# Patient Record
Sex: Female | Born: 1985 | Race: White | Hispanic: No | Marital: Married | Smoking: Former smoker
Health system: Southern US, Community
[De-identification: ages and names within clinical notes are randomized; demographics above are authoritative.]

## PROBLEM LIST (undated history)

## (undated) DIAGNOSIS — B019 Varicella without complication: Secondary | ICD-10-CM

## (undated) DIAGNOSIS — O24419 Gestational diabetes mellitus in pregnancy, unspecified control: Secondary | ICD-10-CM

## (undated) DIAGNOSIS — Z202 Contact with and (suspected) exposure to infections with a predominantly sexual mode of transmission: Secondary | ICD-10-CM

## (undated) DIAGNOSIS — H9192 Unspecified hearing loss, left ear: Secondary | ICD-10-CM

## (undated) DIAGNOSIS — R51 Headache: Secondary | ICD-10-CM

## (undated) DIAGNOSIS — F32A Depression, unspecified: Secondary | ICD-10-CM

## (undated) DIAGNOSIS — E119 Type 2 diabetes mellitus without complications: Secondary | ICD-10-CM

## (undated) DIAGNOSIS — S129XXA Fracture of neck, unspecified, initial encounter: Secondary | ICD-10-CM

## (undated) DIAGNOSIS — G43909 Migraine, unspecified, not intractable, without status migrainosus: Secondary | ICD-10-CM

## (undated) DIAGNOSIS — F419 Anxiety disorder, unspecified: Secondary | ICD-10-CM

## (undated) DIAGNOSIS — F329 Major depressive disorder, single episode, unspecified: Secondary | ICD-10-CM

## (undated) HISTORY — DX: Migraine, unspecified, not intractable, without status migrainosus: G43.909

## (undated) HISTORY — DX: Anxiety disorder, unspecified: F41.9

## (undated) HISTORY — DX: Fracture of neck, unspecified, initial encounter: S12.9XXA

## (undated) HISTORY — DX: Varicella without complication: B01.9

## (undated) HISTORY — DX: Headache: R51

## (undated) HISTORY — DX: Unspecified hearing loss, left ear: H91.92

## (undated) HISTORY — DX: Major depressive disorder, single episode, unspecified: F32.9

## (undated) HISTORY — DX: Contact with and (suspected) exposure to infections with a predominantly sexual mode of transmission: Z20.2

## (undated) HISTORY — DX: Gestational diabetes mellitus in pregnancy, unspecified control: O24.419

## (undated) HISTORY — DX: Type 2 diabetes mellitus without complications: E11.9

## (undated) HISTORY — DX: Depression, unspecified: F32.A

---
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
## 2003-04-05 LAB — CONVERTED CEMR LAB: Pap Smear: ABNORMAL

## 2009-10-29 ENCOUNTER — Ambulatory Visit: Payer: Self-pay | Admitting: Internal Medicine

## 2009-10-29 DIAGNOSIS — F32A Depression, unspecified: Secondary | ICD-10-CM | POA: Insufficient documentation

## 2009-10-29 DIAGNOSIS — R1011 Right upper quadrant pain: Secondary | ICD-10-CM | POA: Insufficient documentation

## 2009-10-29 DIAGNOSIS — R51 Headache: Secondary | ICD-10-CM | POA: Insufficient documentation

## 2009-10-29 DIAGNOSIS — F329 Major depressive disorder, single episode, unspecified: Secondary | ICD-10-CM

## 2009-10-29 DIAGNOSIS — F419 Anxiety disorder, unspecified: Secondary | ICD-10-CM | POA: Insufficient documentation

## 2009-10-29 DIAGNOSIS — B977 Papillomavirus as the cause of diseases classified elsewhere: Secondary | ICD-10-CM | POA: Insufficient documentation

## 2009-10-29 DIAGNOSIS — F411 Generalized anxiety disorder: Secondary | ICD-10-CM

## 2009-10-29 DIAGNOSIS — R519 Headache, unspecified: Secondary | ICD-10-CM | POA: Insufficient documentation

## 2009-10-29 LAB — CONVERTED CEMR LAB
ALT: 23 units/L (ref 0–35)
AST: 19 units/L (ref 0–37)
Albumin: 3.6 g/dL (ref 3.5–5.2)
Alkaline Phosphatase: 89 units/L (ref 39–117)
BUN: 9 mg/dL (ref 6–23)
Basophils Absolute: 0 10*3/uL (ref 0.0–0.1)
Basophils Relative: 0.4 % (ref 0.0–3.0)
Beta hcg, urine, semiquantitative: NEGATIVE
Bilirubin Urine: NEGATIVE
Bilirubin, Direct: 0.1 mg/dL (ref 0.0–0.3)
Blood in Urine, dipstick: NEGATIVE
CO2: 27 meq/L (ref 19–32)
Calcium: 8.5 mg/dL (ref 8.4–10.5)
Chloride: 98 meq/L (ref 96–112)
Creatinine, Ser: 0.5 mg/dL (ref 0.4–1.2)
Eosinophils Absolute: 0 10*3/uL (ref 0.0–0.7)
Eosinophils Relative: 0.3 % (ref 0.0–5.0)
GFR calc non Af Amer: 155.19 mL/min (ref >60)
Glucose, Bld: 97 mg/dL (ref 70–99)
Glucose, Urine, Semiquant: NEGATIVE
HCT: 36 % (ref 36.0–46.0)
Hemoglobin: 12.2 g/dL (ref 12.0–15.0)
Ketones, urine, test strip: NEGATIVE
Lymphocytes Relative: 14.2 % (ref 12.0–46.0)
Lymphs Abs: 1.8 10*3/uL (ref 0.7–4.0)
MCHC: 34 g/dL (ref 30.0–36.0)
MCV: 92.2 fL (ref 78.0–100.0)
Monocytes Absolute: 0.7 10*3/uL (ref 0.1–1.0)
Monocytes Relative: 5.8 % (ref 3.0–12.0)
Neutro Abs: 9.8 10*3/uL — ABNORMAL HIGH (ref 1.4–7.7)
Neutrophils Relative %: 79.3 % — ABNORMAL HIGH (ref 43.0–77.0)
Nitrite: NEGATIVE
Platelets: 403 10*3/uL — ABNORMAL HIGH (ref 150.0–400.0)
Potassium: 3.9 meq/L (ref 3.5–5.1)
Protein, U semiquant: NEGATIVE
RBC: 3.9 M/uL (ref 3.87–5.11)
RDW: 14.6 % (ref 11.5–14.6)
Sed Rate: 39 mm/hr — ABNORMAL HIGH (ref 0–22)
Sodium: 137 meq/L (ref 135–145)
Specific Gravity, Urine: >=1.03
TSH: 1.31 microintl units/mL (ref 0.35–5.50)
Total Bilirubin: 0.5 mg/dL (ref 0.3–1.2)
Total Protein: 6.5 g/dL (ref 6.0–8.3)
Urobilinogen, UA: 0.2
WBC Urine, dipstick: NEGATIVE
WBC: 12.4 10*3/uL — ABNORMAL HIGH (ref 4.5–10.5)
hCG, Beta Chain, Quant, S: 0.35 milliintl units/mL
pH: 6

## 2009-10-30 ENCOUNTER — Telehealth: Payer: Self-pay | Admitting: Internal Medicine

## 2009-10-31 ENCOUNTER — Ambulatory Visit: Payer: Self-pay | Admitting: Family Medicine

## 2009-10-31 DIAGNOSIS — M4310 Spondylolisthesis, site unspecified: Secondary | ICD-10-CM | POA: Insufficient documentation

## 2009-11-02 ENCOUNTER — Telehealth: Payer: Self-pay | Admitting: Internal Medicine

## 2009-11-06 ENCOUNTER — Telehealth: Payer: Self-pay | Admitting: Internal Medicine

## 2009-11-09 ENCOUNTER — Encounter: Payer: Self-pay | Admitting: Internal Medicine

## 2010-05-04 NOTE — Progress Notes (Signed)
Summary: Follow up on how pt is doing  Phone Note Outgoing Call   Call placed by: Romualdo Bolk, CMA Duncan Dull),  November 02, 2009 12:48 PM Call placed to: Patient Summary of Call: Calling to see how pt is doing. Left message for pt to call back. Pt needs to collect another ua for gc/cha. Initial call taken by: Romualdo Bolk, CMA (AAMA),  November 02, 2009 12:51 PM  Follow-up for Phone Call        Spoke to pt and she is feeling better. She has some pain but not as bad as before. Pt to come in either Wed or thurs for the urine gc/ch. test. Follow-up by: Romualdo Bolk, CMA (AAMA),  November 03, 2009 4:36 PM

## 2010-05-04 NOTE — Letter (Signed)
Summary: PATIENT HISTORY  PATIENT HISTORY   Imported By: Everrett Coombe 11/09/2009 15:06:23  _____________________________________________________________________  External Attachment:    Type:   Image     Comment:   External Document

## 2010-05-04 NOTE — Progress Notes (Signed)
Summary: Pt needs ov today for possible infection  Phone Note Call from Patient Call back at Home Phone [phone]   Caller: Patient Summary of Call: I have called all numbers four times and left message to have pt call office to come in today for an appt to discuss possible infection. I tried to call the numbers that pt gave [place] to contact her mom [phone] and [phone] but these numbers were disconnected.  Initial call taken by: Romualdo Bolk, CMA Duncan Dull),  October 30, 2009 9:59 AM  Follow-up for Phone Call        Pt called back and left a voicemail saying that she is still having abd. pain.  I called pt back but got her voicemail again. I left a message saying that we need her to come in before 4pm today but if she wanted to call us back to have someone overhead page me. Follow-up by: Romualdo Bolk, CMA Duncan Dull),  October 30, 2009 2:34 PM  Additional Follow-up for Phone Call Additional follow up Details #1::        Per Dr. Fabian Sharp Doxy 100mg  1 by mouth two times a day x 10 days. Also see sat md tomorrow.   Pt aware and appt made. Pt states that she is in alot of abd. pain and is using enema.   Per Dr. Fabian Sharp Ibuprofen 800mg  #30 1 q 6-8 hours as needed for pain no refills. Pt aware of this as well. Additional Follow-up by: Romualdo Bolk, CMA (AAMA),  October 30, 2009 3:35 PM    New/Updated Medications: DOXYCYCLINE HYCLATE 100 MG TABS (DOXYCYCLINE HYCLATE) 1 by mouth two times a day x 10 days IBUPROFEN 800 MG TABS (IBUPROFEN) 1 by mouth q 6-8 hours as needed for pain Prescriptions: IBUPROFEN 800 MG TABS (IBUPROFEN) 1 by mouth q 6-8 hours as needed for pain  #30 x 0   Entered by:   Romualdo Bolk, CMA (AAMA)   Authorized by:   Madelin Headings MD   Signed by:   Romualdo Bolk, CMA (AAMA) on 10/30/2009   Method used:   Electronically to        [facility] [id number] (retail)       [address] [place], [place]  [address]       Ph: [phone]       Fax:  [phone]   RxID:   [id number] DOXYCYCLINE HYCLATE 100 MG TABS (DOXYCYCLINE HYCLATE) 1 by mouth two times a day x 10 days  #20 x 0   Entered by:   Romualdo Bolk, CMA (AAMA)   Authorized by:   Madelin Headings MD   Signed by:   Romualdo Bolk, CMA (AAMA) on 10/30/2009   Method used:   Electronically to        [facility] [place] [address] (retail)       [address] [place], [place]  [address]       Ph: [phone]       Fax: [phone]   RxID:   [id number]
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
## 2010-05-04 NOTE — Assessment & Plan Note (Signed)
Summary: abd. pain/ssc   Vital Signs:  Patient profile:   25 year old female Menstrual status:  regular Weight:      144 pounds Temp:     98.3 degrees F oral Pulse rate:   88 / minute Pulse rhythm:   regular BP sitting:   124 / 78  (right arm) Cuff size:   regular  Vitals Entered By: Lowella Petties CMA (October 31, 2009 10:18 AM) CC: Pain under right ribs   History of Present Illness: hAS SOME UNEXPLAINED PAIN UNDER HER RIB CAGE,   Monday started, the sharpest pain, started after having some chinese food. Thursday felt could. Feels a littel bit bit better and feels like she has more energy. Has a high pain tolerance.   LMP 10/15/2009  review the case in full, reviewed all laboratory, and reviewed notes and personal communication with Dr. Fabian Sharp.   the patient is a 5 day history of abdominal pain, primarily located in the RIGHT upper quadrant, with some additional intermittent pain in the other quadrants of the abdomen. Today, she is on day 2 doxycycline, and she is feeling better.  additionally she did have an enema, which did not produce much stool.  She's currently not febrile, she is eating, but she continues to have some significant abdominal pain, primarily located in the RIGHT upper quadrant. Risk factors include some intercourse without condom use.  Continues to deny vomiting or diarrhea. No significant vaginal discharge.   Mild LEFT shift and mild elevation of white blood count, but with a very normal CT of the abdomen.  Also discussed with patient that she does have an old spondylolisthesis is present and picked up on CT exam. This would not explain the patient's abdominal pain. 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 She does have a notable history for back pain when growing up. True anterolisthesis is present.  Tests: (1) CBC Platelet w/Diff (CBCD)   White Cell Count     [H]   12.4 K/uL                   4.5-10.5   Red Cell Count            3.90 Mil/uL                 3.87-5.11   Hemoglobin                12.2 g/dL                   16.1-09.6   Hematocrit                36.0 %                      36.0-46.0   MCV                       92.2 fl                     78.0-100.0   MCHC                      34.0 g/dL                   04.5-40.9   RDW  14.6 %                      11.5-14.6   Platelet Count       [H]  403.0 K/uL                  150.0-400.0   Neutrophil %         [H]  79.3 %                      43.0-77.0   Lymphocyte %              14.2 %                      12.0-46.0   Monocyte %                5.8 %                       3.0-12.0   Eosinophils%              0.3 %                       0.0-5.0   Basophils %               0.4 %                       0.0-3.0   Neutrophill Absolute [H]  9.8 K/uL                    1.4-7.7   Lymphocyte Absolute       1.8 K/uL                    0.7-4.0   Monocyte Absolute         0.7 K/uL                    0.1-1.0  Eosinophils, Absolute                             0.0 K/uL                    0.0-0.7   Basophils Absolute        0.0 K/uL                    0.0-0.1  Tests: (2) BMP (METABOL)   Sodium                    137 mEq/L                   135-145   Potassium                 3.9 mEq/L                   3.5-5.1   Chloride                  98 mEq/L                    96-112   Carbon Dioxide            27 mEq/L  19-32   Glucose                   97 mg/dL                    16-10   BUN                       9 mg/dL                     9-60   Creatinine                0.5 mg/dL                   4.5-4.0   Calcium                   8.5 mg/dL                   9.8-11.9   GFR                       155.19 mL/min               >60    Tests: (3) Hepatic/Liver Function Panel (HEPATIC)   Total Bilirubin           0.5 mg/dL                   1.4-7.8   Direct Bilirubin          0.1  mg/dL                   2.9-5.6   Alkaline Phosphatase      89 U/L                      39-117   AST                       19 U/L                      0-37   ALT                       23 U/L                      0-35   Total Protein             6.5 g/dL                    2.1-3.0   Albumin                   3.6 g/dL                    8.6-5.7  Tests: (4) TSH (TSH)   FastTSH                   1.31 uIU/mL                 0.35-5.50  Tests: (5) Sed Rate (ESR)   Sed Rate             [H]  39 mm/hr                    0-22  Tests: (6)  Pregnancy Serum, Quant (HCG-QN)   BHCG                      0.35 mIU/mL     0 - 1 Weeks  5 - 50     1 - 2 Weeks  50 - 500     2 - 3 Weeks  100 - 5,000     3 - 4 Weeks  500 - 10,000     4 - 5 Weeks  1,000 - 50,000     5 - 6 Weeks  10,000 - 100,000     6 - 8 Weeks  15,000 - 200,000     8 - 12 Weeks  10,000 - 100,0000     CT ABD/PELVIS W CM - [id number]   Clinical Data: Sudden onset right sided abdominal pain with low grade fever and constipation.   CT ABDOMEN AND PELVIS WITH CONTRAST   Technique:  Multidetector CT imaging of the abdomen and pelvis was performed following the standard protocol during bolus administration of intravenous contrast.   Contrast: 100 ml Omnipaque-300   Comparison: None.   Findings: Lung bases show minimal dependent atelectasis.  Heart size normal.  No pericardial or pleural effusion.   Liver, gallbladder and adrenal glands are unremarkable.  Sub centimeter low attenuation lesions in the kidneys are likely cysts. Spleen, pancreas, stomach, and bowel, including appendix, are unremarkable.  Appendix is best seen on coronal images 37-43). Uterus and ovaries are visualized.  No pathologically enlarged lymph nodes.  No free fluid.  No worrisome lytic or sclerotic lesions. Bilateral L5 pars defects with grade II anterolisthesis of L5 on S1, with associated degenerative disc disease.   IMPRESSION:   1.  No evidence of acute  appendicitis.  No findings to explain the patient's given symptoms. 2.  Bilateral L5 pars defects with grade II anterolisthesis of L5 on S1.   Read By:  Reyes Ivan.,  M.D.     Released By:  Reyes Ivan.,  M.D.   Allergies: 1)  ! Sulfa  Past History:  Past medical, surgical, family and social histories (including risk factors) reviewed, and no changes noted (except as noted below).  Past Medical History: Reviewed history from 10/29/2009 and no changes required. Depression/Anxiety G0P0 ? last pap   hx of HPV Varicella  Headache migraines   Past Surgical History: Reviewed history from 10/29/2009 and no changes required. 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 Father: Kidney Stones Mother: Chron's, HBP, borderline DM Siblings: Brother- High Cholesterol, colon polyps GP with ? too much iron   Social History: Reviewed history from 10/29/2009 and no changes required. Occupation: unemployed Divorced Current Smoker    Alcohol use-yes-Socially Drug use-no Regular exercise-yes unsure what her insurance is.  living  with mom currently   Review of Systems      See HPI  Physical Exam  General:  Well-developed,well-nourished,in no acute distress; alert,appropriate and cooperative throughout examination Head:  Normocephalic and atraumatic without obvious abnormalities. No apparent alopecia or balding. Lungs:  Normal respiratory effort, chest expands symmetrically. Lungs are clear to auscultation, no crackles or wheezes. Heart:  Normal rate and regular rhythm. S1 and S2 normal without gallop, murmur, click, rub or other extra sounds. Abdomen:  upper quadrant is tender to palpation. Minimal tenderness to palpation, with occasional tenderness to palpation in the RIGHT and LEFT lower quadrants. No rebound tenderness. No guarding. No distention,  no masses. No hepatosplenomegaly. No hernias. Genitalia:    Bimanual exam performed. Cervix is nontender. Ovaries are palpated and nontender. Uterus is nontender. Cervical Nodes:  No lymphadenopathy noted Inguinal Nodes:  No significant adenopathy Psych:  Cognition and judgment appear intact. Alert and cooperative with normal attention span and concentration. No apparent delusions, illusions, hallucinations   Impression & Recommendations:  Problem # 1:  ABDOMINAL PAIN RIGHT UPPER QUADRANT (ICD-789.01) continues to have RIGHT quadrant pain, though it is mildly improved. There was a normal CT abdomen and pelvis, but outpatient HIDA scan set-up next week if symptoms persist at recheck would be reasonable. I've asked her to follow up with her primary care provider on Monday.  Additionally, we will cover for gonorrhea exposure. Gonorrhea Chlamydia continue to be pending  Nontoxic.  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  this is old, nonacute, does not need further followup other than routine core stability training.  At this point, there would be a fibrous old union, and stable.  Complete Medication List: 1)  Celexa 40 Mg Tabs (Citalopram hydrobromide) .Marland Kitchen.. 1 by mouth once daily 2)  Alprazolam 1 Mg Tabs (Alprazolam) .Marland Kitchen.. 1 by mouth three times a day 3)  Doxycycline Hyclate 100 Mg Tabs (Doxycycline hyclate) .Marland Kitchen.. 1 by mouth two times a day x 10 days 4)  Ibuprofen 800 Mg Tabs (Ibuprofen) .Marland Kitchen.. 1 by mouth q 6-8 hours as needed for pain  Patient Instructions: 1)  CONSTIPATION 2)  Difficult, uncomfortable, infrequent BM 3)  1. Warm prune juice, hot water, tea, coffee, apple juice 4)  2. Prevention: drink 8 glasses water daily, FIBER (raw fruit, veggies, bran cereal, whole grains), regular exercise 5)  3. Bulk formers like Metamucil (psyllium), Citrucel (methylcellulose) usually help 6)  4. Stool softeners (Docusate)  occaisionally OK 7)  5. Occaisional over the counter Miralax usually safe 8)  6. Overuse of stimulant  laxatives (Ex-Lax, Mag Citrate, Dulcolax, etc.) can be habit forming     Medication Administration  Injection # 1:    Medication: Rocephin  250mg     Diagnosis: ABDOMINAL PAIN RIGHT UPPER QUADRANT (ICD-789.01)    Route: IM    Site: LUOQ gluteus    Exp Date: 10/2010    Lot #: [id number]    Mfr: [email or web address]    Patient tolerated injection without complications    Given by: Lowella Petties CMA (October 31, 2009 11:58 AM)  Orders Added: 1)  Rocephin  250mg  [J0696] 2)  Admin of Therapeutic Inj  intramuscular or subcutaneous [96372] 3)  Est. Patient Level IV [14782]

## 2010-05-04 NOTE — Progress Notes (Addendum)
Summary: never got labs done  Phone Note From Other Clinic   Caller: Lab Summary of Call: Pt never came back in to get Urine GC/Ch done. Initial call taken by: Romualdo Bolk, CMA (AAMA),  November 06, 2009 4:56 PM

## 2010-05-05 DIAGNOSIS — S129XXA Fracture of neck, unspecified, initial encounter: Secondary | ICD-10-CM | POA: Insufficient documentation

## 2010-05-05 HISTORY — DX: Fracture of neck, unspecified, initial encounter: S12.9XXA

## 2010-05-22 ENCOUNTER — Emergency Department (HOSPITAL_COMMUNITY)
Admission: EM | Admit: 2010-05-22 | Discharge: 2010-05-23 | Disposition: A | Discharge status: Home / Self Care | Payer: No Typology Code available for payment source | Attending: Emergency Medicine | Admitting: Emergency Medicine

## 2010-05-22 ENCOUNTER — Emergency Department (HOSPITAL_COMMUNITY): Payer: Self-pay

## 2010-05-22 ENCOUNTER — Emergency Department (HOSPITAL_COMMUNITY): Payer: No Typology Code available for payment source

## 2010-05-22 DIAGNOSIS — M79609 Pain in unspecified limb: Secondary | ICD-10-CM | POA: Insufficient documentation

## 2010-05-22 DIAGNOSIS — S12100A Unspecified displaced fracture of second cervical vertebra, initial encounter for closed fracture: Secondary | ICD-10-CM | POA: Insufficient documentation

## 2010-05-22 DIAGNOSIS — S060X9A Concussion with loss of consciousness of unspecified duration, initial encounter: Secondary | ICD-10-CM | POA: Insufficient documentation

## 2010-05-22 DIAGNOSIS — S8010XA Contusion of unspecified lower leg, initial encounter: Secondary | ICD-10-CM | POA: Insufficient documentation

## 2010-05-22 DIAGNOSIS — IMO0002 Reserved for concepts with insufficient information to code with codable children: Secondary | ICD-10-CM | POA: Insufficient documentation

## 2010-05-22 DIAGNOSIS — M542 Cervicalgia: Secondary | ICD-10-CM | POA: Insufficient documentation

## 2010-05-22 DIAGNOSIS — S0100XA Unspecified open wound of scalp, initial encounter: Secondary | ICD-10-CM | POA: Insufficient documentation

## 2010-05-22 DIAGNOSIS — R51 Headache: Secondary | ICD-10-CM | POA: Insufficient documentation

## 2010-05-22 DIAGNOSIS — M25559 Pain in unspecified hip: Secondary | ICD-10-CM | POA: Insufficient documentation

## 2010-05-22 LAB — POCT I-STAT, CHEM 8
BUN: 9 mg/dL (ref 6–23)
Calcium, Ion: 1.26 mmol/L (ref 1.12–1.32)
Chloride: 104 mEq/L (ref 96–112)
Creatinine, Ser: 0.8 mg/dL (ref 0.4–1.2)
HCT: 41 % (ref 36.0–46.0)
Hemoglobin: 13.9 g/dL (ref 12.0–15.0)
Sodium: 141 mEq/L (ref 135–145)

## 2010-06-10 ENCOUNTER — Encounter: Payer: Self-pay | Admitting: Internal Medicine

## 2010-08-13 IMAGING — CT CT ABD-PELV W/ CM
2 of 4 series · 17 of 46 positions shown, 19 images · IV contrast (Omnipaque 300)
Comparison: None.

CLINICAL DATA: Sudden onset right sided abdominal pain with low
grade fever and constipation.

CT ABDOMEN AND PELVIS WITH CONTRAST
TECHNIQUE: Multidetector CT imaging of the abdomen and pelvis was
performed following the standard protocol during bolus
administration of intravenous contrast.
Contrast: 100 ml Lmnipaque-PAA

[Series 2: abd/ pel 5mm · axial · 0.76mm/px · z∈[-448,-18]mm · 14 of 94 slices shown, 16 images]
[im 4/94  soft-tissue]
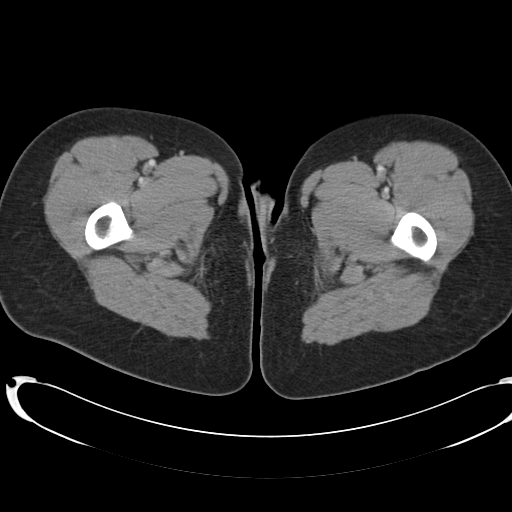
[im 4/94  bone]
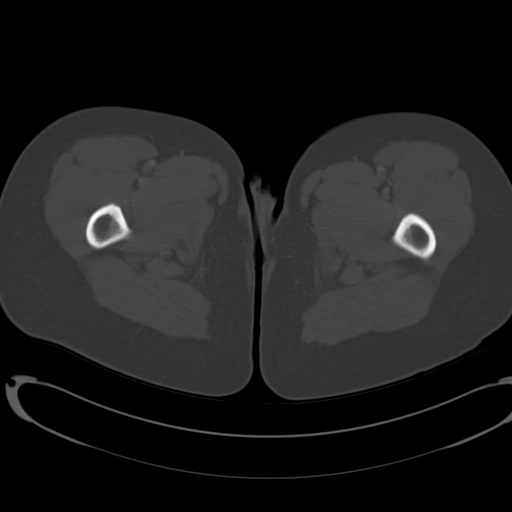
[im 12/94  soft-tissue]
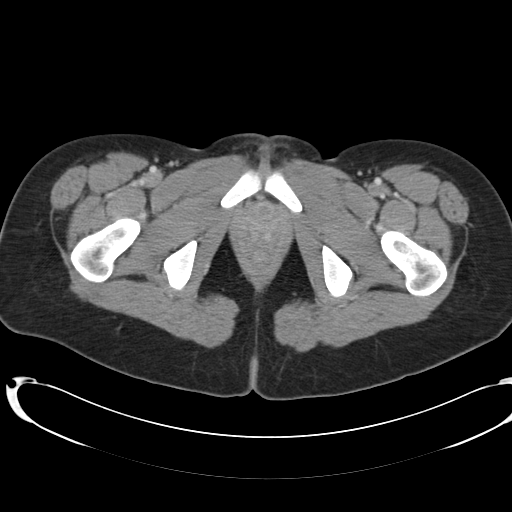
[im 19/94  soft-tissue]
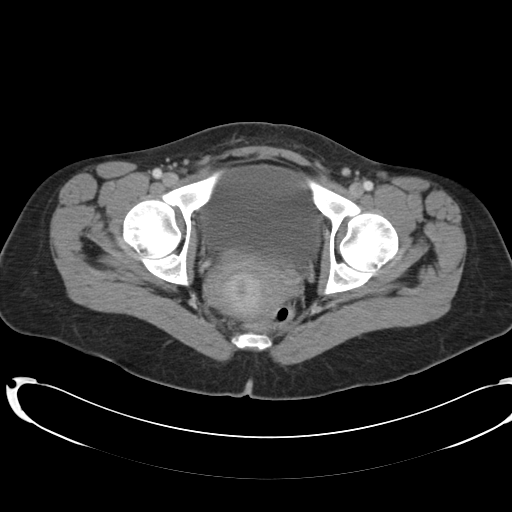
[im 27/94  soft-tissue]
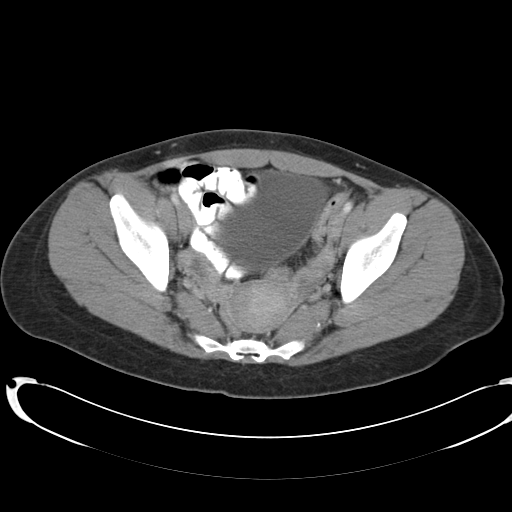
[im 30/94  soft-tissue]
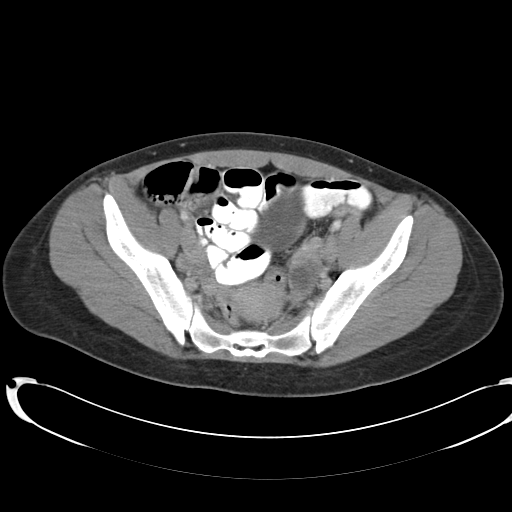
[im 38/94  soft-tissue]
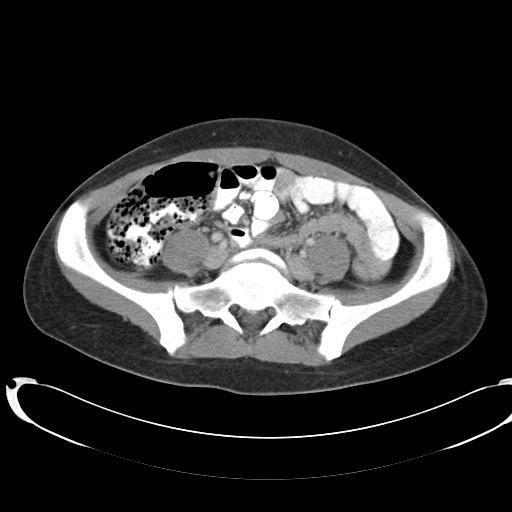
[im 45/94  soft-tissue]
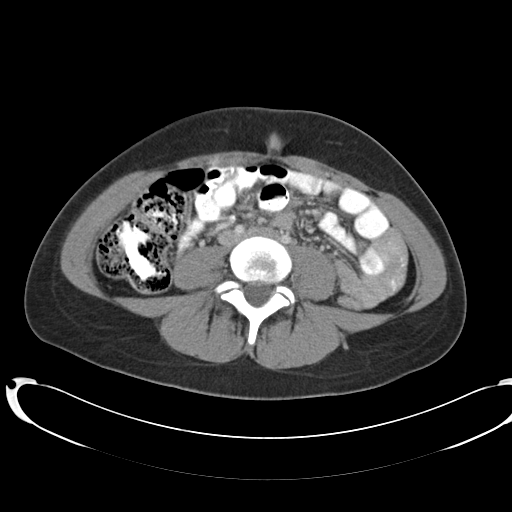
[im 49/94  soft-tissue]
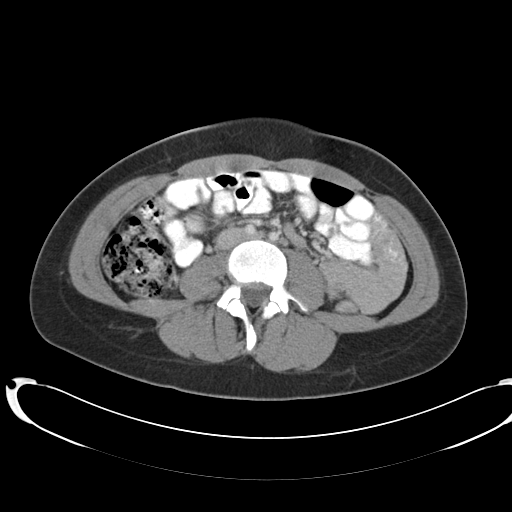
[im 56/94  soft-tissue]
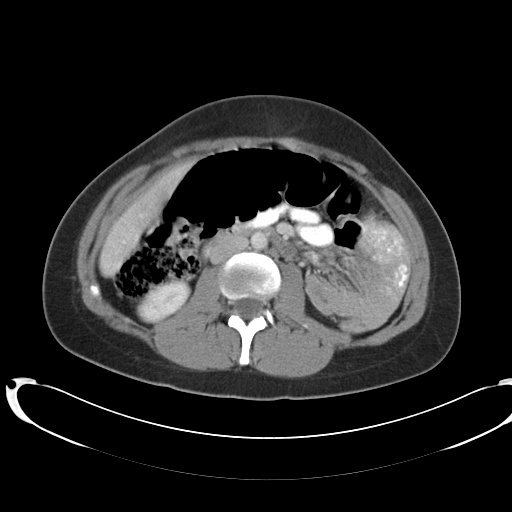
[im 56/94  bone]
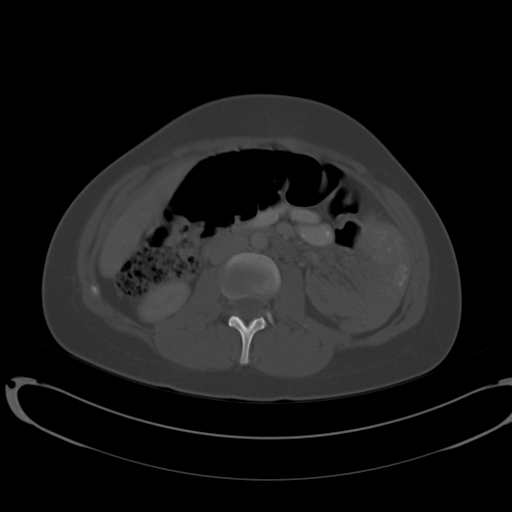
[im 64/94  soft-tissue]
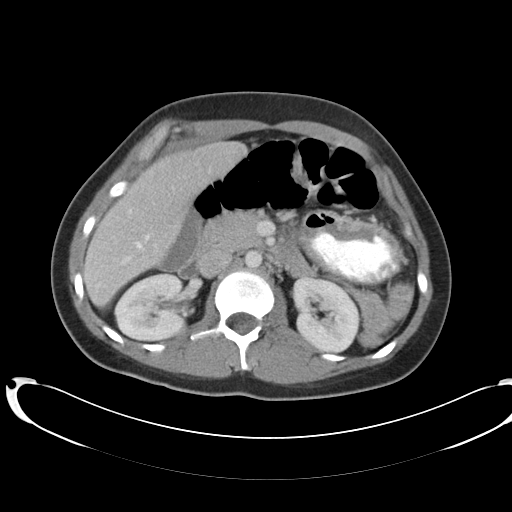
[im 71/94  soft-tissue]
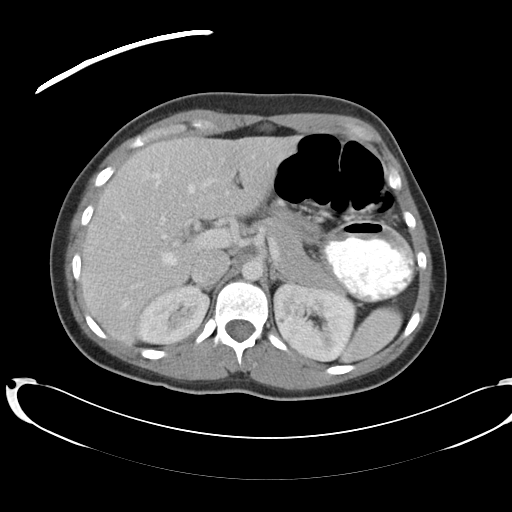
[im 75/94  soft-tissue]
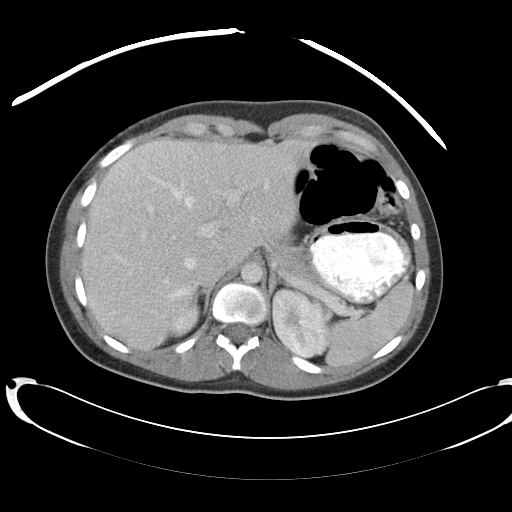
[im 82/94  soft-tissue]
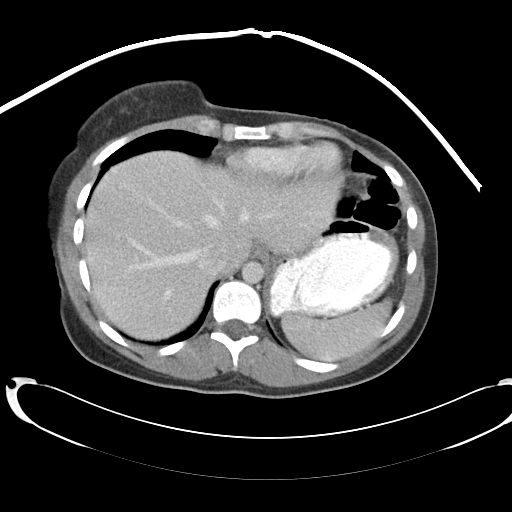
[im 90/94  soft-tissue]
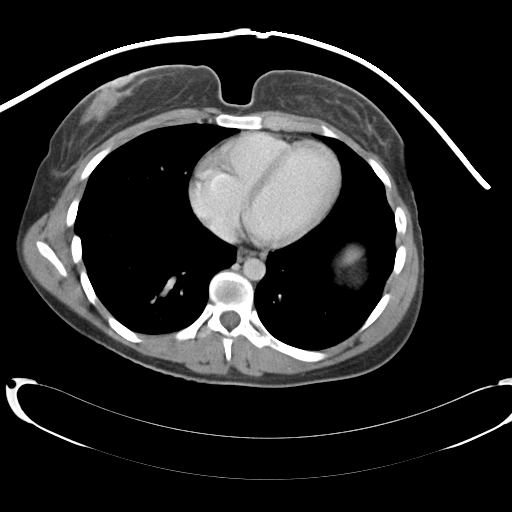

[Series 602: <mpr range> · coronal · 0.91mm/px · 3 of 106 slices shown]
[im 36/106  soft-tissue]
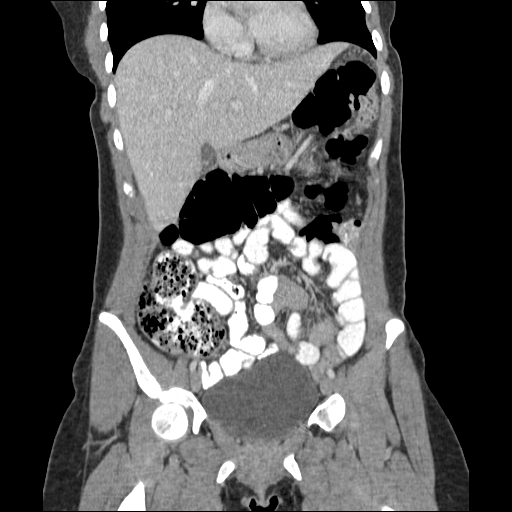
[im 47/106  soft-tissue]
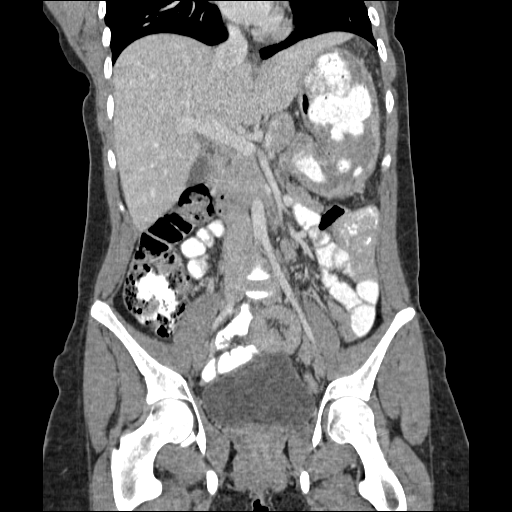
[im 59/106  soft-tissue]
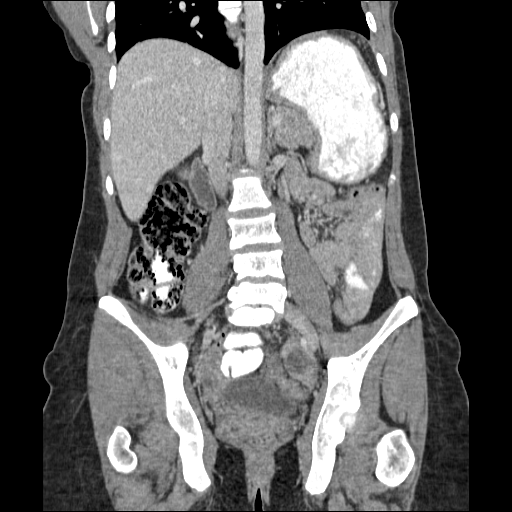

[17 of 46 positions shown; findings below may reference images not displayed]

FINDINGS: Lung bases show minimal dependent atelectasis.  Heart
size normal.  No pericardial or pleural effusion.

Liver, gallbladder and adrenal glands are unremarkable.  Sub
centimeter low attenuation lesions in the kidneys are likely cysts.
Spleen, pancreas, stomach, and bowel, including appendix, are
unremarkable.  Appendix is best seen on coronal images 37-43).
Uterus and ovaries are visualized.  No pathologically enlarged
lymph nodes.  No free fluid.  No worrisome lytic or sclerotic
lesions. Bilateral L5 pars defects with grade II anterolisthesis of
L5 on S1, with associated degenerative disc disease.
IMPRESSION: 1.  No evidence of acute appendicitis.  No findings to explain the
patient's given symptoms.
2.  Bilateral L5 pars defects with grade II anterolisthesis of L5
on S1.

## 2010-11-12 ENCOUNTER — Encounter: Payer: Self-pay | Admitting: Internal Medicine

## 2010-11-15 ENCOUNTER — Ambulatory Visit (INDEPENDENT_AMBULATORY_CARE_PROVIDER_SITE_OTHER): Payer: BC Managed Care – PPO | Admitting: Family Medicine

## 2010-11-15 ENCOUNTER — Encounter: Payer: Self-pay | Admitting: Family Medicine

## 2010-11-15 VITALS — BP 130/90 | Temp 98.3°F

## 2010-11-15 DIAGNOSIS — R51 Headache: Secondary | ICD-10-CM

## 2010-11-15 DIAGNOSIS — R519 Headache, unspecified: Secondary | ICD-10-CM

## 2010-11-15 MED ORDER — NORTRIPTYLINE HCL 10 MG PO CAPS: 10.0000 mg | ORAL_CAPSULE | Freq: Every day | ORAL | Status: DC

## 2010-11-15 NOTE — Patient Instructions (Signed)
Gradually wean off ibuprofen and other analgesics. May increase Pamelor to two at night if headaches no better in one week.
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
## 2010-11-25 ENCOUNTER — Ambulatory Visit (INDEPENDENT_AMBULATORY_CARE_PROVIDER_SITE_OTHER): Payer: BC Managed Care – PPO | Admitting: Internal Medicine

## 2010-11-25 ENCOUNTER — Encounter: Payer: Self-pay | Admitting: Internal Medicine

## 2010-11-25 VITALS — BP 140/82 | HR 88 | Wt 155.0 lb

## 2010-11-25 DIAGNOSIS — S129XXA Fracture of neck, unspecified, initial encounter: Secondary | ICD-10-CM

## 2010-11-25 DIAGNOSIS — G44301 Post-traumatic headache, unspecified, intractable: Secondary | ICD-10-CM

## 2010-11-25 DIAGNOSIS — R51 Headache: Secondary | ICD-10-CM

## 2010-11-25 DIAGNOSIS — G44309 Post-traumatic headache, unspecified, not intractable: Secondary | ICD-10-CM

## 2010-11-25 MED ORDER — NORTRIPTYLINE HCL 10 MG PO CAPS: 20.0000 mg | ORAL_CAPSULE | Freq: Every day | ORAL | Status: DC

## 2010-11-25 MED ORDER — KETOROLAC TROMETHAMINE 60 MG/2ML IM SOLN
60.0000 mg | Freq: Once | INTRAMUSCULAR | Status: AC
  Administered 2010-11-25: 60 mg via INTRAMUSCULAR
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
## 2010-11-29 ENCOUNTER — Ambulatory Visit: Payer: No Typology Code available for payment source | Admitting: Internal Medicine

## 2011-10-17 ENCOUNTER — Other Ambulatory Visit: Payer: BC Managed Care – PPO

## 2011-10-31 ENCOUNTER — Encounter: Payer: BC Managed Care – PPO | Admitting: Internal Medicine

## 2011-10-31 DIAGNOSIS — Z0289 Encounter for other administrative examinations: Secondary | ICD-10-CM

## 2012-05-10 ENCOUNTER — Other Ambulatory Visit: Payer: BC Managed Care – PPO

## 2012-05-14 ENCOUNTER — Ambulatory Visit: Payer: BC Managed Care – PPO | Admitting: Internal Medicine

## 2013-10-01 ENCOUNTER — Telehealth: Payer: Self-pay | Admitting: Internal Medicine

## 2013-10-01 NOTE — Telephone Encounter (Signed)
Please ask if there  Are forms additional problems  Ok to make preventive visit cpx if needed may need another visit if  problem to evalute also .

## 2013-10-01 NOTE — Telephone Encounter (Signed)
Pt was last seen aug 2012 and would like to have cpx asap. Can I create 30  Min slot?

## 2013-10-02 NOTE — Telephone Encounter (Signed)
Pt has been sch for 10/28/13

## 2013-10-28 ENCOUNTER — Other Ambulatory Visit (HOSPITAL_COMMUNITY)
Admission: RE | Admit: 2013-10-28 | Discharge: 2013-10-28 | Disposition: A | Discharge status: Home / Self Care | Payer: No Typology Code available for payment source | Source: Ambulatory Visit | Attending: Internal Medicine | Admitting: Internal Medicine

## 2013-10-28 ENCOUNTER — Ambulatory Visit (INDEPENDENT_AMBULATORY_CARE_PROVIDER_SITE_OTHER): Payer: No Typology Code available for payment source | Admitting: Internal Medicine

## 2013-10-28 ENCOUNTER — Encounter: Payer: Self-pay | Admitting: Internal Medicine

## 2013-10-28 VITALS — BP 114/78 | Temp 98.7°F | Ht 64.5 in | Wt 179.0 lb

## 2013-10-28 DIAGNOSIS — R21 Rash and other nonspecific skin eruption: Secondary | ICD-10-CM | POA: Insufficient documentation

## 2013-10-28 DIAGNOSIS — Z0000 Encounter for general adult medical examination without abnormal findings: Secondary | ICD-10-CM | POA: Insufficient documentation

## 2013-10-28 DIAGNOSIS — F411 Generalized anxiety disorder: Secondary | ICD-10-CM

## 2013-10-28 DIAGNOSIS — G8929 Other chronic pain: Secondary | ICD-10-CM

## 2013-10-28 DIAGNOSIS — Z789 Other specified health status: Secondary | ICD-10-CM

## 2013-10-28 DIAGNOSIS — Z9189 Other specified personal risk factors, not elsewhere classified: Secondary | ICD-10-CM

## 2013-10-28 DIAGNOSIS — R202 Paresthesia of skin: Secondary | ICD-10-CM

## 2013-10-28 DIAGNOSIS — Z01419 Encounter for gynecological examination (general) (routine) without abnormal findings: Secondary | ICD-10-CM

## 2013-10-28 DIAGNOSIS — F17200 Nicotine dependence, unspecified, uncomplicated: Secondary | ICD-10-CM | POA: Insufficient documentation

## 2013-10-28 DIAGNOSIS — M549 Dorsalgia, unspecified: Secondary | ICD-10-CM

## 2013-10-28 DIAGNOSIS — M542 Cervicalgia: Secondary | ICD-10-CM

## 2013-10-28 DIAGNOSIS — Z23 Encounter for immunization: Secondary | ICD-10-CM

## 2013-10-28 DIAGNOSIS — R209 Unspecified disturbances of skin sensation: Secondary | ICD-10-CM

## 2013-10-28 LAB — BASIC METABOLIC PANEL
BUN: 11 mg/dL (ref 6–23)
CALCIUM: 9.2 mg/dL (ref 8.4–10.5)
CHLORIDE: 111 meq/L (ref 96–112)
CO2: 26 meq/L (ref 19–32)
Creatinine, Ser: 0.8 mg/dL (ref 0.4–1.2)
GFR: 93.69 mL/min (ref >60.00)
GLUCOSE: 92 mg/dL (ref 70–99)
POTASSIUM: 3.9 meq/L (ref 3.5–5.1)
SODIUM: 141 meq/L (ref 135–145)

## 2013-10-28 LAB — CBC WITH DIFFERENTIAL/PLATELET
BASOS ABS: 0 10*3/uL (ref 0.0–0.1)
BASOS PCT: 0.4 % (ref 0.0–3.0)
EOS ABS: 0 10*3/uL (ref 0.0–0.7)
Eosinophils Relative: 0.6 % (ref 0.0–5.0)
HEMATOCRIT: 39.5 % (ref 36.0–46.0)
HEMOGLOBIN: 13.2 g/dL (ref 12.0–15.0)
LYMPHS ABS: 3 10*3/uL (ref 0.7–4.0)
LYMPHS PCT: 44.6 % (ref 12.0–46.0)
MCHC: 33.4 g/dL (ref 30.0–36.0)
MCV: 90.7 fl (ref 78.0–100.0)
Monocytes Absolute: 0.5 10*3/uL (ref 0.1–1.0)
Monocytes Relative: 7.4 % (ref 3.0–12.0)
NEUTROS ABS: 3.1 10*3/uL (ref 1.4–7.7)
Neutrophils Relative %: 47 % (ref 43.0–77.0)
Platelets: 348 10*3/uL (ref 150.0–400.0)
RBC: 4.36 Mil/uL (ref 3.87–5.11)
RDW: 13.2 % (ref 11.5–15.5)
WBC: 6.6 10*3/uL (ref 4.0–10.5)

## 2013-10-28 LAB — LIPID PANEL
CHOLESTEROL: 150 mg/dL (ref 0–200)
HDL: 52.9 mg/dL (ref >39.00)
LDL Cholesterol: 89 mg/dL (ref 0–99)
NONHDL: 97.1
Total CHOL/HDL Ratio: 3
Triglycerides: 41 mg/dL (ref 0.0–149.0)
VLDL: 8.2 mg/dL (ref 0.0–40.0)

## 2013-10-28 LAB — HEPATIC FUNCTION PANEL
ALBUMIN: 4.2 g/dL (ref 3.5–5.2)
ALK PHOS: 64 U/L (ref 39–117)
ALT: 23 U/L (ref 0–35)
AST: 21 U/L (ref 0–37)
Bilirubin, Direct: 0 mg/dL (ref 0.0–0.3)
TOTAL PROTEIN: 6.7 g/dL (ref 6.0–8.3)
Total Bilirubin: 0.6 mg/dL (ref 0.2–1.2)

## 2013-10-28 LAB — TSH: TSH: 1.44 u[IU]/mL (ref 0.35–4.50)

## 2013-10-28 LAB — HEMOGLOBIN A1C: HEMOGLOBIN A1C: 5.6 % (ref 4.6–6.5)

## 2013-10-28 LAB — T4, FREE: Free T4: 0.9 ng/dL (ref 0.60–1.60)
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
## 2013-10-28 NOTE — Patient Instructions (Addendum)
Tracking  Helps get to healthy weight . Such as weight watchers.  .Healthy lifestyle includes : At least 150 minutes of exercise weeks  , weight at healthy levels, which is usually   BMI 19-25. Avoid trans fats and processed foods;  Increase fresh fruits and veges to 5 servings per day. And avoid sweet beverages including tea and juice. Mediterranean diet with olive oil and nuts have been noted to be heart and brain healthy . Avoid tobacco products . Limit  alcohol to  7 per week for women and 14 servings for men.  Get adequate sleep .  Will get some names  If needed for fertility opinoiin You exam is normal today . Can plan ROV if need to discuss evaluate other issues.  folic acid evey day if  Trying to get pregnant.  Talk with dr Evelene CroonKaur about meds  And pregnancy

## 2013-10-29 LAB — HEPATITIS C ANTIBODY: HCV AB: NEGATIVE

## 2013-10-29 LAB — HIV ANTIBODY (ROUTINE TESTING W REFLEX): HIV: NONREACTIVE

## 2013-10-31 LAB — CYTOLOGY - PAP

## 2013-11-04 ENCOUNTER — Encounter: Payer: Self-pay | Admitting: Family Medicine

## 2014-02-03 ENCOUNTER — Encounter: Payer: Self-pay | Admitting: Internal Medicine

## 2014-03-25 ENCOUNTER — Telehealth: Payer: Self-pay

## 2014-03-25 NOTE — Telephone Encounter (Signed)
Tried to reach the pt to follow up on phone call and to see if she went to the walk in clinic.

## 2014-03-25 NOTE — Telephone Encounter (Signed)
[facility] - Client TELEPHONE ADVICE RECORD [facility] Patient Name: Brandy Castro Gender: Female DOB: 1986/04/03 Age: [age] Y 2 M 7 D Return Phone Number: [phone] (Primary) Address: [place] [id number] Client [facility] - Client Client Site [facility] Day Physician Berniece AndreasPanosh, Wanda Contact Type Call Call Type Triage / Clinical Relationship To Patient Self Return Phone Number [phone] (Primary) Chief Complaint Runny or Stuffy Nose Initial Comment Caller states every time she takes a drink , it comes out of her right nostril PreDisposition Call Doctor Nurse Assessment Nurse: Elijah Birkaldwell, RN, Stark BrayLynda Date/Time (Eastern Time): 03/25/2014 11:06:47 AM Confirm and document reason for call. If symptomatic, describe symptoms. ---Caller states every time she takes a drink, it comes out of her right nostril. It is painful when it happens, difficult to swallow. Just started Sat. evening. Has the patient traveled out of the country within the last 30 days? ---Not Applicable Does the patient require triage? ---Yes Related visit to physician within the last 2 weeks? ---No Does the PT have any chronic conditions? (i.e. diabetes, asthma, etc.) ---No Did the patient indicate they were pregnant? ---No Guidelines Guideline Title Affirmed Question Affirmed Notes Nurse Date/Time Lamount Cohen(Eastern Time) Nose - Foreign Body Nasal FB, but all triage questions negative Allyson SabalCaldwell, RN, Lynda 03/25/2014 11:11:26 AM Swallowing Difficulty [1] Swallowing difficulty AND [2] cause unknown (Exception: difficulty swallowing is a chronic symptom) Elijah BirkCaldwell, RN, Lynda 03/25/2014 11:14:02 AM Disp. Time Lamount Cohen(Eastern Time) Disposition Final User 03/25/2014 11:13:28 AM Home Care Elginaldwell, RN, Stark BrayLynda 03/25/2014 11:16:38 AM See Physician within 24 Hours Yes Elijah Birkaldwell, RN, Stark BrayLynda PLEASE NOTE: All timestamps  contained within this report are represented as [place] Standard Time. CONFIDENTIALTY NOTICE: This fax transmission is intended only for the addressee. It contains information that is legally privileged, confidential or otherwise protected from use or disclosure. If you are not the intended recipient, you are strictly prohibited from reviewing, disclosing, copying using or disseminating any of this information or taking any action in reliance on or regarding this information. If you have received this fax in error, please notify us immediately by telephone so that we can arrange for its return to us. Phone: [phone], Toll-Free: [phone], Fax: [phone] Page: 2 of 2 Call Id: [id number] Caller Understands: Yes Disagree/Comply: Phil Doppomply Caller Understands: Yes Disagree/Comply: Comply Care Advice Given Per Guideline CARE ADVICE given per Nose - Foreign Body (Adult) guideline. HOME CARE: You should be able to treat this at home. CALL BACK IF: * Can't remove FB * FB removed and pain persists over 2 hours * Develop yellow nasal discharge. BLOW THE NOSE: Blow the nose vigorously several times. Often this action will expel the object. Occluding the normal opening with your fingertip may help. Saline or warm water nose drops can be used to loosen up the object before blowing it out. CARE ADVICE given per Swallowed Difficulty (Adult) guideline. SEE PHYSICIAN WITHIN 24 HOURS: * IF OFFICE WILL BE OPEN: You need to be examined within the next 24 hours. Call your doctor when the office opens, and make an appointment. CALL BACK IF: * You become worse. After Care Instructions Given Call Event Type User Date / Time Description Comments User: Lily LovingsLynda, Caldwell, RN Date/Time Lamount Cohen(Eastern Time): 03/25/2014 11:20:38 AM Caller states there are no appts. available. Advised to try the walk-in clinic on [address] to be seen for this. Referrals GO TO FACILITY UNDECIDED

## 2014-03-26 ENCOUNTER — Encounter (HOSPITAL_COMMUNITY): Payer: Self-pay

## 2014-03-26 ENCOUNTER — Emergency Department (HOSPITAL_COMMUNITY)
Admission: EM | Admit: 2014-03-26 | Discharge: 2014-03-26 | Disposition: A | Discharge status: Home / Self Care | Payer: No Typology Code available for payment source | Source: Home / Self Care | Attending: Emergency Medicine | Admitting: Emergency Medicine

## 2014-03-26 DIAGNOSIS — J069 Acute upper respiratory infection, unspecified: Secondary | ICD-10-CM

## 2014-03-26 DIAGNOSIS — G521 Disorders of glossopharyngeal nerve: Secondary | ICD-10-CM

## 2014-03-26 LAB — POCT RAPID STREP A: STREPTOCOCCUS, GROUP A SCREEN (DIRECT): NEGATIVE

## 2014-03-26 MED ORDER — IPRATROPIUM BROMIDE 0.06 % NA SOLN: 2.0000 | Freq: Four times a day (QID) | NASAL | Status: DC

## 2014-03-26 MED ORDER — PREDNISONE 20 MG PO TABS: ORAL_TABLET | ORAL | Status: DC

## 2014-03-26 MED ORDER — BENZONATATE 200 MG PO CAPS: 200.0000 mg | ORAL_CAPSULE | Freq: Three times a day (TID) | ORAL | Status: DC | PRN

## 2014-03-26 NOTE — Discharge Instructions (Signed)
Glossopharyngeal Neuralgia Glossopharyngeal neuralgia is a disorder characterized by intense pain. The pain happens in the:  Tonsils.  Middle ear.  Back of the tongue. The pain can come and go, or it can be fairly persistent.  CAUSES  Compression of the 9th nerve (glossopharyngeal) or 10th nerve (vagus) causes glossopharyngeal neuralgia. These nerves come from your brain (cranial nerves). Compression may be from:  Tumors.  Peritonsillar abscesses.  Vascular aneurysms. In some cases, no cause is evident. Triggers of the disorder may include:  Swallowing.  Chewing.  Talking.  Sneezing.  Eating spicy foods. TREATMENT  Generally, treatment is symptomatic. Medications may be prescribed to reduce the pain. In some cases, surgery may be needed to relieve pressure on the nerve. People with this disorder have remissions. They can also have times of increased pain. For many individuals, drug therapy reduces or eliminates the pain enough for them to carry on with their lives. When surgery is needed, most patients have very good results. Document Released: 03/11/2002 Document Revised: 06/13/2011 Document Reviewed: 04/25/2008 [facility] Patient Information 2015 [place] [place]. This information is not intended to replace advice given to you by your health care provider. Make sure you discuss any questions you have with your health care provider.

## 2014-03-26 NOTE — ED Notes (Addendum)
States she was okay until Saturday PM, when she started to have ST, and every time she drinks anything, it comes out her nose. NAD

## 2014-03-26 NOTE — Telephone Encounter (Signed)
Pt seen at urgent care

## 2014-03-26 NOTE — ED Provider Notes (Signed)
Chief Complaint   Facial Pain   History of Present Illness   Brandy Castro is a 28 year old female who presents with a five-day history of sore throat, hoarseness, malaise, fatigue, subjective fever, chills, and sweats. She is also noted nasal congestion and a dry cough. Her biggest complaint however is that whenever she drinks any liquids, the liquids come back out through her right nostril. She denies any purulent drainage from her nose, but she has had some burning and irritated feeling in her right nostril. She denies any headache, visual symptoms, facial weakness or numbness, extremity weakness or numbness, or difficulty with speaking.  Review of Systems   Other than as noted above, the patient denies any of the following symptoms: Systemic:  No fevers, chills, sweats, or myalgias. Eye:  No redness or discharge. ENT:  No ear pain, headache, nasal congestion, drainage, sinus pressure, or sore throat. Neck:  No neck pain, stiffness, or swollen glands. Lungs:  No cough, sputum production, hemoptysis, wheezing, chest tightness, shortness of breath or chest pain. GI:  No abdominal pain, nausea, vomiting or diarrhea.  PMFSH   Past medical history, family history, social history, meds, and allergies were reviewed.   Physical exam   Vital signs:  BP 128/90 mmHg  Pulse 91  Temp(Src) 98.3 F (36.8 C) (Oral)  Resp 16  SpO2 100% General:  Alert and oriented.  In no distress.  Skin warm and dry. Eye:  No conjunctival injection or drainage. Lids were normal. ENT:  TMs and canals were normal, without erythema or inflammation.  Nasal mucosa was clear and uncongested, without drainage.  Mucous membranes were moist.  Pharynx was clear with no exudate or drainage. Her palate elevates to the left, right side does not elevate at all. Uvula deviates to the left. There is no evidence of the mass or peritonsillar abscess. The throat otherwise looks completely normal. There were no oral ulcerations  or lesions. Neck:  Supple, no adenopathy, tenderness or mass. Lungs:  No respiratory distress.  Lungs were clear to auscultation, without wheezes, rales or rhonchi.  Breath sounds were clear and equal bilaterally.  Heart:  Regular rhythm, without gallops, murmers or rubs. Skin:  Clear, warm, and dry, without rash or lesions.  Labs   Results for orders placed or performed during the hospital encounter of 03/26/14  POCT rapid strep A [facility])  Result Value Ref Range   Streptococcus, Group A Screen (Direct) NEGATIVE NEGATIVE    Course in Urgent Care Center   I called a neurology consultant and discussed this with him. He felt that she needed to have an MRI of her cranium. I suggested she go to the emergency room this afternoon. I told the patient that this could be due to stroke, tumor, or idiopathic. The patient understands this. She is competent to make her own decision. She declines transfer to the emergency department, even though she can repeat back to me the risks of not going to the emergency room today. She was allowed to sign out AGAINST MEDICAL ADVICE, although I did tell her she could come back any time the emergency room and we would treat her for her upper respiratory symptoms.  Assessment     The primary encounter diagnosis was Viral URI. 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  There is no evidence of pneumonia, strep throat, sinusitis, otitis media.    Plan    1.  Meds:  The following meds were  prescribed:   Discharge Medication List as of 03/26/2014  2:58 PM    START taking these medications   Details  benzonatate (TESSALON) 200 MG capsule Take 1 capsule (200 mg total) by mouth 3 (three) times daily as needed for cough., Starting 03/26/2014, Until Discontinued, Normal    ipratropium (ATROVENT) 0.06 % nasal spray Place 2 sprays into both nostrils 4 (four) times daily., Starting 03/26/2014, Until Discontinued, Normal    predniSONE  (DELTASONE) 20 MG tablet Take 3 daily for 5 days, 2 daily for 5 days, 1 daily for 5 days., Normal        2.  Patient Education/Counseling:  The patient was given appropriate handouts, self care instructions, and instructed in symptomatic relief.  Instructed to get extra fluids and extra rest.    3.  Follow up:  The patient was told to follow up here if no better in 3 to 4 days, or sooner if becoming worse in any way, and given some red flag symptoms such as increasing fever, difficulty breathing, chest pain, or persistent vomiting which would prompt immediate return. Suggested she follow-up with Dr. Shon MilletAdam Jaffe as soon as possible.      Reuben Likesavid C Dayshaun Whobrey, MD 03/26/14 [phone]

## 2014-03-28 LAB — CULTURE, GROUP A STREP

## 2015-02-25 ENCOUNTER — Ambulatory Visit (INDEPENDENT_AMBULATORY_CARE_PROVIDER_SITE_OTHER): Payer: Self-pay | Admitting: Internal Medicine

## 2015-02-25 NOTE — Progress Notes (Signed)
Document opened and reviewed for OVwellness visit . No showed .   

## 2015-02-25 NOTE — Patient Instructions (Signed)
Pap due 2018  Health Maintenance, Female Adopting a healthy lifestyle and getting preventive care can go a long way to promote health and wellness. Talk with your health care provider about what schedule of regular examinations is right for you. This is a good chance for you to check in with your provider about disease prevention and staying healthy. In between checkups, there are plenty of things you can do on your own. Experts have done a lot of research about which lifestyle changes and preventive measures are most likely to keep you healthy. Ask your health care provider for more information. WEIGHT AND DIET  Eat a healthy diet  Be sure to include plenty of vegetables, fruits, low-fat dairy products, and lean protein.  Do not eat a lot of foods high in solid fats, added sugars, or salt.  Get regular exercise. This is one of the most important things you can do for your health.  Most adults should exercise for at least 150 minutes each week. The exercise should increase your heart rate and make you sweat (moderate-intensity exercise).  Most adults should also do strengthening exercises at least twice a week. This is in addition to the moderate-intensity exercise.  Maintain a healthy weight  Body mass index (BMI) is a measurement that can be used to identify possible weight problems. It estimates body fat based on height and weight. Your health care provider can help determine your BMI and help you achieve or maintain a healthy weight.  For females 38 years of age and older:   A BMI below 18.5 is considered underweight.  A BMI of 18.5 to 24.9 is normal.  A BMI of 25 to 29.9 is considered overweight.  A BMI of 30 and above is considered obese.  Watch levels of cholesterol and blood lipids  You should start having your blood tested for lipids and cholesterol at 31 years of age, then have this test every 5 years.  You may need to have your cholesterol levels checked more often  if:  Your lipid or cholesterol levels are high.  You are older than 31 years of age.  You are at high risk for heart disease.  CANCER SCREENING   Lung Cancer  Lung cancer screening is recommended for adults 32-70 years old who are at high risk for lung cancer because of a history of smoking.  A yearly low-dose CT scan of the lungs is recommended for people who:  Currently smoke.  Have quit within the past 15 years.  Have at least a 30-pack-year history of smoking. A pack year is smoking an average of one pack of cigarettes a day for 1 year.  Yearly screening should continue until it has been 15 years since you quit.  Yearly screening should stop if you develop a health problem that would prevent you from having lung cancer treatment.  Breast Cancer  Practice breast self-awareness. This means understanding how your breasts normally appear and feel.  It also means doing regular breast self-exams. Let your health care provider know about any changes, no matter how small.  If you are in your 20s or 30s, you should have a clinical breast exam (CBE) by a health care provider every 1-3 years as part of a regular health exam.  If you are 67 or older, have a CBE every year. Also consider having a breast X-ray (mammogram) every year.  If you have a family history of breast cancer, talk to your health care provider about  genetic screening.  If you are at high risk for breast cancer, talk to your health care provider about having an MRI and a mammogram every year.  Breast cancer gene (BRCA) assessment is recommended for women who have family members with BRCA-related cancers. BRCA-related cancers include:  Breast.  Ovarian.  Tubal.  Peritoneal cancers.  Results of the assessment will determine the need for genetic counseling and BRCA1 and BRCA2 testing. Cervical Cancer Your health care provider may recommend that you be screened regularly for cancer of the pelvic organs  (ovaries, uterus, and vagina). This screening involves a pelvic examination, including checking for microscopic changes to the surface of your cervix (Pap test). You may be encouraged to have this screening done every 3 years, beginning at age 21.  For women ages 30-65, health care providers may recommend pelvic exams and Pap testing every 3 years, or they may recommend the Pap and pelvic exam, combined with testing for human papilloma virus (HPV), every 5 years. Some types of HPV increase your risk of cervical cancer. Testing for HPV may also be done on women of any age with unclear Pap test results.  Other health care providers may not recommend any screening for nonpregnant women who are considered low risk for pelvic cancer and who do not have symptoms. Ask your health care provider if a screening pelvic exam is right for you.  If you have had past treatment for cervical cancer or a condition that could lead to cancer, you need Pap tests and screening for cancer for at least 20 years after your treatment. If Pap tests have been discontinued, your risk factors (such as having a new sexual partner) need to be reassessed to determine if screening should resume. Some women have medical problems that increase the chance of getting cervical cancer. In these cases, your health care provider may recommend more frequent screening and Pap tests. Colorectal Cancer  This type of cancer can be detected and often prevented.  Routine colorectal cancer screening usually begins at 31 years of age and continues through 31 years of age.  Your health care provider may recommend screening at an earlier age if you have risk factors for colon cancer.  Your health care provider may also recommend using home test kits to check for hidden blood in the stool.  A small camera at the end of a tube can be used to examine your colon directly (sigmoidoscopy or colonoscopy). This is done to check for the earliest forms of  colorectal cancer.  Routine screening usually begins at age 50.  Direct examination of the colon should be repeated every 5-10 years through 31 years of age. However, you may need to be screened more often if early forms of precancerous polyps or small growths are found. Skin Cancer  Check your skin from head to toe regularly.  Tell your health care provider about any new moles or changes in moles, especially if there is a change in a mole's shape or color.  Also tell your health care provider if you have a mole that is larger than the size of a pencil eraser.  Always use sunscreen. Apply sunscreen liberally and repeatedly throughout the day.  Protect yourself by wearing long sleeves, pants, a wide-brimmed hat, and sunglasses whenever you are outside. HEART DISEASE, DIABETES, AND HIGH BLOOD PRESSURE   High blood pressure causes heart disease and increases the risk of stroke. High blood pressure is more likely to develop in:  People who have blood   pressure in the high end of the normal range (130-139/85-89 mm Hg).  People who are overweight or obese.  People who are African American.  If you are 18-39 years of age, have your blood pressure checked every 3-5 years. If you are 40 years of age or older, have your blood pressure checked every year. You should have your blood pressure measured twice--once when you are at a hospital or clinic, and once when you are not at a hospital or clinic. Record the average of the two measurements. To check your blood pressure when you are not at a hospital or clinic, you can use:  An automated blood pressure machine at a pharmacy.  A home blood pressure monitor.  If you are between 55 years and 79 years old, ask your health care provider if you should take aspirin to prevent strokes.  Have regular diabetes screenings. This involves taking a blood sample to check your fasting blood sugar level.  If you are at a normal weight and have a low risk for  diabetes, have this test once every three years after 31 years of age.  If you are overweight and have a high risk for diabetes, consider being tested at a younger age or more often. PREVENTING INFECTION  Hepatitis B  If you have a higher risk for hepatitis B, you should be screened for this virus. You are considered at high risk for hepatitis B if:  You were born in a country where hepatitis B is common. Ask your health care provider which countries are considered high risk.  Your parents were born in a high-risk country, and you have not been immunized against hepatitis B (hepatitis B vaccine).  You have HIV or AIDS.  You use needles to inject street drugs.  You live with someone who has hepatitis B.  You have had sex with someone who has hepatitis B.  You get hemodialysis treatment.  You take certain medicines for conditions, including cancer, organ transplantation, and autoimmune conditions. Hepatitis C  Blood testing is recommended for:  Everyone born from 1945 through 1965.  Anyone with known risk factors for hepatitis C. Sexually transmitted infections (STIs)  You should be screened for sexually transmitted infections (STIs) including gonorrhea and chlamydia if:  You are sexually active and are younger than 31 years of age.  You are older than 31 years of age and your health care provider tells you that you are at risk for this type of infection.  Your sexual activity has changed since you were last screened and you are at an increased risk for chlamydia or gonorrhea. Ask your health care provider if you are at risk.  If you do not have HIV, but are at risk, it may be recommended that you take a prescription medicine daily to prevent HIV infection. This is called pre-exposure prophylaxis (PrEP). You are considered at risk if:  You are sexually active and do not regularly use condoms or know the HIV status of your partner(s).  You take drugs by injection.  You are  sexually active with a partner who has HIV. Talk with your health care provider about whether you are at high risk of being infected with HIV. If you choose to begin PrEP, you should first be tested for HIV. You should then be tested every 3 months for as long as you are taking PrEP.  PREGNANCY   If you are premenopausal and you may become pregnant, ask your health care provider about preconception   counseling.  If you may become pregnant, take 400 to 800 micrograms (mcg) of folic acid every day.  If you want to prevent pregnancy, talk to your health care provider about birth control (contraception). OSTEOPOROSIS AND MENOPAUSE   Osteoporosis is a disease in which the bones lose minerals and strength with aging. This can result in serious bone fractures. Your risk for osteoporosis can be identified using a bone density scan.  If you are 54 years of age or older, or if you are at risk for osteoporosis and fractures, ask your health care provider if you should be screened.  Ask your health care provider whether you should take a calcium or vitamin D supplement to lower your risk for osteoporosis.  Menopause may have certain physical symptoms and risks.  Hormone replacement therapy may reduce some of these symptoms and risks. Talk to your health care provider about whether hormone replacement therapy is right for you.  HOME CARE INSTRUCTIONS   Schedule regular health, dental, and eye exams.  Stay current with your immunizations.   Do not use any tobacco products including cigarettes, chewing tobacco, or electronic cigarettes.  If you are pregnant, do not drink alcohol.  If you are breastfeeding, limit how much and how often you drink alcohol.  Limit alcohol intake to no more than 1 drink per day for nonpregnant women. One drink equals 12 ounces of beer, 5 ounces of wine, or 1 ounces of hard liquor.  Do not use street drugs.  Do not share needles.  Ask your health care provider for  help if you need support or information about quitting drugs.  Tell your health care provider if you often feel depressed.  Tell your health care provider if you have ever been abused or do not feel safe at home.   This information is not intended to replace advice given to you by your health care provider. Make sure you discuss any questions you have with your health care provider.   Document Released: 10/04/2010 Document Revised: 04/11/2014 Document Reviewed: 02/20/2013 Elsevier Interactive Patient Education [employer].

## 2016-02-15 ENCOUNTER — Encounter (HOSPITAL_COMMUNITY): Payer: Self-pay | Admitting: Emergency Medicine

## 2016-02-15 ENCOUNTER — Ambulatory Visit (HOSPITAL_COMMUNITY)
Admission: EM | Admit: 2016-02-15 | Discharge: 2016-02-15 | Disposition: A | Discharge status: Home / Self Care | Payer: 59 | Attending: Emergency Medicine | Admitting: Emergency Medicine

## 2016-02-15 DIAGNOSIS — R599 Enlarged lymph nodes, unspecified: Secondary | ICD-10-CM
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
## 2016-02-15 NOTE — ED Triage Notes (Signed)
Pt has noticed some swelling in her glands in her left neck for the last 4-5 days.  She reports some sinus congestion at night and feeling of dehydration.  Pt denies any fever.

## 2016-02-15 NOTE — Discharge Instructions (Signed)
On examination the only lymph node palpated is the one directly behind the left ear. No other signs of inflammation or infection are seen. No other enlarged lymph nodes are observed or palpated. This is likely a reaction to the recent ear piercing and possibly a reaction to the multiple piercings of the left ear. No signs of infection are seen. If there continues to be tenderness or enlargement of the lymph node follow-up with your PCP next week. In the meantime, if she developed fever, surrounding pain, sore throat or other symptoms sick medical attention promptly. May apply heat to the area and take Tylenol or ibuprofen as needed.

## 2016-12-23 ENCOUNTER — Encounter: Payer: Self-pay | Admitting: Internal Medicine

## 2017-04-04 NOTE — L&D Delivery Note (Signed)
Delivery Note At 12:00 AM a viable and healthy female was delivered via Vaginal, Spontaneous (Presentation: OA).  APGAR: 7, 9; weight  -pending.   Placenta status: spontaneous, complete.  Cord:  with the following complications: CAN x1.   Cord pH: None  Anesthesia:  Epidural Episiotomy: Left Mediolateral, no extensions Lacerations:  None other Suture Repair: 3.0 vicryl rapide Est. Blood Loss (mL): 200  Mom to postpartum.  Baby to Couplet care / Skin to Skin.  Robley FriesVaishali R Kristianna Saperstein 11/09/2017, 12:22 AM

## 2017-04-24 LAB — OB RESULTS CONSOLE GC/CHLAMYDIA
Chlamydia: NEGATIVE
Gonorrhea: NEGATIVE

## 2017-04-24 LAB — OB RESULTS CONSOLE HIV ANTIBODY (ROUTINE TESTING): HIV: NONREACTIVE

## 2017-04-24 LAB — OB RESULTS CONSOLE ANTIBODY SCREEN: Antibody Screen: NEGATIVE

## 2017-04-24 LAB — OB RESULTS CONSOLE ABO/RH: RH TYPE: POSITIVE

## 2017-04-24 LAB — OB RESULTS CONSOLE RPR: RPR: NONREACTIVE

## 2017-04-24 LAB — OB RESULTS CONSOLE RUBELLA ANTIBODY, IGM: Rubella: IMMUNE

## 2017-04-24 LAB — OB RESULTS CONSOLE HEPATITIS B SURFACE ANTIGEN: HEP B S AG: NEGATIVE

## 2017-08-23 ENCOUNTER — Encounter: Payer: Self-pay | Admitting: Registered"

## 2017-08-23 ENCOUNTER — Encounter: Payer: No Typology Code available for payment source | Attending: Obstetrics & Gynecology | Admitting: Registered"

## 2017-08-23 DIAGNOSIS — Z713 Dietary counseling and surveillance: Secondary | ICD-10-CM | POA: Insufficient documentation

## 2017-08-23 DIAGNOSIS — R7309 Other abnormal glucose: Secondary | ICD-10-CM | POA: Insufficient documentation

## 2017-08-23 DIAGNOSIS — O24419 Gestational diabetes mellitus in pregnancy, unspecified control: Secondary | ICD-10-CM | POA: Insufficient documentation

## 2017-08-23 NOTE — Progress Notes (Signed)
Patient was seen on 08/23/17 for Gestational Diabetes self-management class at the [facility]. The following learning objectives were met by the patient during this course:   States the definition of Gestational Diabetes  States why dietary management is important in controlling blood glucose  Describes the effects each nutrient has on blood glucose levels  Demonstrates ability to create a balanced meal plan  Demonstrates carbohydrate counting   States when to check blood glucose levels  Demonstrates proper blood glucose monitoring techniques  States the effect of stress and exercise on blood glucose levels  States the importance of limiting caffeine and abstaining from alcohol and smoking  Blood glucose monitor given: none  Patient instructed to monitor glucose levels: FBS: 60 - <95; 1 hour: <140; 2 hour: <120  Patient received handouts:  Nutrition Diabetes and Pregnancy, including carb counting list  Patient will be seen for follow-up as needed.

## 2017-10-16 LAB — OB RESULTS CONSOLE GBS: GBS: NEGATIVE

## 2017-10-25 ENCOUNTER — Telehealth (HOSPITAL_COMMUNITY): Payer: Self-pay | Admitting: *Deleted

## 2017-10-25 ENCOUNTER — Encounter (HOSPITAL_COMMUNITY): Payer: Self-pay | Admitting: *Deleted

## 2017-10-25 NOTE — Telephone Encounter (Signed)
Preadmission screen  

## 2017-10-27 ENCOUNTER — Other Ambulatory Visit: Payer: Self-pay | Admitting: Obstetrics & Gynecology

## 2017-10-30 ENCOUNTER — Other Ambulatory Visit: Payer: Self-pay | Admitting: Obstetrics & Gynecology

## 2017-11-08 ENCOUNTER — Encounter (HOSPITAL_COMMUNITY): Payer: Self-pay

## 2017-11-08 ENCOUNTER — Inpatient Hospital Stay (HOSPITAL_COMMUNITY)
Admission: RE | Admit: 2017-11-08 | Discharge: 2017-11-10 | DRG: 807 | Disposition: A | Discharge status: Home / Self Care | Payer: No Typology Code available for payment source | Attending: Obstetrics & Gynecology | Admitting: Obstetrics & Gynecology

## 2017-11-08 ENCOUNTER — Inpatient Hospital Stay (HOSPITAL_COMMUNITY): Payer: No Typology Code available for payment source | Admitting: Anesthesiology

## 2017-11-08 ENCOUNTER — Inpatient Hospital Stay (HOSPITAL_COMMUNITY)
Admission: AD | Admit: 2017-11-08 | Payer: No Typology Code available for payment source | Source: Ambulatory Visit | Admitting: Obstetrics & Gynecology

## 2017-11-08 DIAGNOSIS — G8918 Other acute postprocedural pain: Secondary | ICD-10-CM | POA: Diagnosis not present

## 2017-11-08 DIAGNOSIS — O3663X0 Maternal care for excessive fetal growth, third trimester, not applicable or unspecified: Secondary | ICD-10-CM | POA: Diagnosis present

## 2017-11-08 DIAGNOSIS — Z87891 Personal history of nicotine dependence: Secondary | ICD-10-CM

## 2017-11-08 DIAGNOSIS — F419 Anxiety disorder, unspecified: Secondary | ICD-10-CM | POA: Diagnosis present

## 2017-11-08 DIAGNOSIS — Z3A39 39 weeks gestation of pregnancy: Secondary | ICD-10-CM | POA: Diagnosis not present

## 2017-11-08 DIAGNOSIS — O2603 Excessive weight gain in pregnancy, third trimester: Secondary | ICD-10-CM | POA: Diagnosis present

## 2017-11-08 DIAGNOSIS — Z3490 Encounter for supervision of normal pregnancy, unspecified, unspecified trimester: Secondary | ICD-10-CM | POA: Diagnosis present

## 2017-11-08 DIAGNOSIS — O24425 Gestational diabetes mellitus in childbirth, controlled by oral hypoglycemic drugs: Secondary | ICD-10-CM | POA: Diagnosis present

## 2017-11-08 DIAGNOSIS — O99214 Obesity complicating childbirth: Secondary | ICD-10-CM | POA: Diagnosis present

## 2017-11-08 DIAGNOSIS — O24419 Gestational diabetes mellitus in pregnancy, unspecified control: Secondary | ICD-10-CM

## 2017-11-08 DIAGNOSIS — O99344 Other mental disorders complicating childbirth: Secondary | ICD-10-CM | POA: Diagnosis present

## 2017-11-08 DIAGNOSIS — O9089 Other complications of the puerperium, not elsewhere classified: Secondary | ICD-10-CM

## 2017-11-08 HISTORY — DX: Gestational diabetes mellitus in pregnancy, unspecified control: O24.419

## 2017-11-08 LAB — COMPREHENSIVE METABOLIC PANEL
ALK PHOS: 126 U/L (ref 38–126)
ALT: 17 U/L (ref 0–44)
ANION GAP: 14 (ref 5–15)
AST: 24 U/L (ref 15–41)
Albumin: 3.1 g/dL — ABNORMAL LOW (ref 3.5–5.0)
BILIRUBIN TOTAL: 0.2 mg/dL — AB (ref 0.3–1.2)
BUN: 13 mg/dL (ref 6–20)
CALCIUM: 10.3 mg/dL (ref 8.9–10.3)
CO2: 16 mmol/L — ABNORMAL LOW (ref 22–32)
Chloride: 103 mmol/L (ref 98–111)
Creatinine, Ser: 0.49 mg/dL (ref 0.44–1.00)
GFR calc Af Amer: >60 mL/min (ref >60)
GFR calc non Af Amer: >60 mL/min (ref >60)
GLUCOSE: 105 mg/dL — AB (ref 70–99)
POTASSIUM: 3.8 mmol/L (ref 3.5–5.1)
Sodium: 133 mmol/L — ABNORMAL LOW (ref 135–145)
TOTAL PROTEIN: 6.1 g/dL — AB (ref 6.5–8.1)

## 2017-11-08 LAB — URIC ACID: URIC ACID, SERUM: 5.1 mg/dL (ref 2.5–7.1)

## 2017-11-08 LAB — CBC
HCT: 39.7 % (ref 36.0–46.0)
HEMATOCRIT: 37.4 % (ref 36.0–46.0)
HEMOGLOBIN: 13.1 g/dL (ref 12.0–15.0)
Hemoglobin: 12.7 g/dL (ref 12.0–15.0)
MCH: 30.6 pg (ref 26.0–34.0)
MCH: 30.4 pg (ref 26.0–34.0)
MCHC: 34 g/dL (ref 30.0–36.0)
MCHC: 33 g/dL (ref 30.0–36.0)
MCV: 90.1 fL (ref 78.0–100.0)
MCV: 92.1 fL (ref 78.0–100.0)
Platelets: 264 10*3/uL (ref 150–400)
Platelets: 277 10*3/uL (ref 150–400)
RBC: 4.15 MIL/uL (ref 3.87–5.11)
RBC: 4.31 MIL/uL (ref 3.87–5.11)
RDW: 14.8 % (ref 11.5–15.5)
RDW: 15.1 % (ref 11.5–15.5)
WBC: 12.3 10*3/uL — ABNORMAL HIGH (ref 4.0–10.5)
WBC: 12.3 10*3/uL — ABNORMAL HIGH (ref 4.0–10.5)

## 2017-11-08 LAB — TYPE AND SCREEN
ABO/RH(D): O POS
Antibody Screen: NEGATIVE

## 2017-11-08 LAB — GLUCOSE, CAPILLARY
GLUCOSE-CAPILLARY: 89 mg/dL (ref 70–99)
GLUCOSE-CAPILLARY: 88 mg/dL (ref 70–99)
Glucose-Capillary: 89 mg/dL (ref 70–99)
Glucose-Capillary: 103 mg/dL — ABNORMAL HIGH (ref 70–99)

## 2017-11-08 LAB — PROTEIN / CREATININE RATIO, URINE
CREATININE, URINE: 18 mg/dL
Total Protein, Urine: <6 mg/dL

## 2017-11-08 LAB — RPR: RPR Ser Ql: NONREACTIVE

## 2017-11-08 LAB — ABO/RH: ABO/RH(D): O POS

## 2017-11-08 MED ORDER — LACTATED RINGERS IV SOLN
500.0000 mL | Freq: Once | INTRAVENOUS | Status: AC
  Administered 2017-11-08: 500 mL via INTRAVENOUS

## 2017-11-08 MED ORDER — EPHEDRINE 5 MG/ML INJ
10.0000 mg | INTRAVENOUS | Status: DC | PRN
  Filled 2017-11-08: qty 2

## 2017-11-08 MED ORDER — DIPHENHYDRAMINE HCL 50 MG/ML IJ SOLN: 12.5000 mg | INTRAMUSCULAR | Status: DC | PRN

## 2017-11-08 MED ORDER — MISOPROSTOL 25 MCG QUARTER TABLET
25.0000 ug | ORAL_TABLET | ORAL | Status: DC | PRN
  Administered 2017-11-08 (×2): 25 ug via VAGINAL
  Filled 2017-11-08 (×3): qty 1

## 2017-11-08 MED ORDER — PHENYLEPHRINE 40 MCG/ML (10ML) SYRINGE FOR IV PUSH (FOR BLOOD PRESSURE SUPPORT)
80.0000 ug | PREFILLED_SYRINGE | INTRAVENOUS | Status: DC | PRN
  Filled 2017-11-08: qty 5

## 2017-11-08 MED ORDER — LACTATED RINGERS IV SOLN: 500.0000 mL | INTRAVENOUS | Status: DC | PRN

## 2017-11-08 MED ORDER — ACETAMINOPHEN 325 MG PO TABS: 650.0000 mg | ORAL_TABLET | ORAL | Status: DC | PRN

## 2017-11-08 MED ORDER — TERBUTALINE SULFATE 1 MG/ML IJ SOLN
0.2500 mg | Freq: Once | INTRAMUSCULAR | Status: DC | PRN
  Filled 2017-11-08: qty 1

## 2017-11-08 MED ORDER — LIDOCAINE HCL (PF) 1 % IJ SOLN
INTRAMUSCULAR | Status: DC | PRN
  Administered 2017-11-08: 5 mL via EPIDURAL
  Administered 2017-11-08: 3 mL via EPIDURAL
  Administered 2017-11-08: 2 mL via EPIDURAL

## 2017-11-08 MED ORDER — OXYTOCIN 40 UNITS IN LACTATED RINGERS INFUSION - SIMPLE MED
1.0000 m[IU]/min | INTRAVENOUS | Status: DC
  Administered 2017-11-08: 2 m[IU]/min via INTRAVENOUS

## 2017-11-08 MED ORDER — SOD CITRATE-CITRIC ACID 500-334 MG/5ML PO SOLN: 30.0000 mL | ORAL | Status: DC | PRN

## 2017-11-08 MED ORDER — FENTANYL 2.5 MCG/ML BUPIVACAINE 1/10 % EPIDURAL INFUSION (WH - ANES)
14.0000 mL/h | INTRAMUSCULAR | Status: DC | PRN
  Administered 2017-11-08: 14 mL/h via EPIDURAL
  Filled 2017-11-08: qty 100

## 2017-11-08 MED ORDER — OXYTOCIN 40 UNITS IN LACTATED RINGERS INFUSION - SIMPLE MED
INTRAVENOUS | Status: AC
  Administered 2017-11-08: 2 m[IU]/min via INTRAVENOUS
  Filled 2017-11-08: qty 1000

## 2017-11-08 MED ORDER — PHENYLEPHRINE 40 MCG/ML (10ML) SYRINGE FOR IV PUSH (FOR BLOOD PRESSURE SUPPORT)
80.0000 ug | PREFILLED_SYRINGE | INTRAVENOUS | Status: DC | PRN
  Filled 2017-11-08: qty 10
  Filled 2017-11-08: qty 5

## 2017-11-08 MED ORDER — LACTATED RINGERS IV SOLN: 500.0000 mL | Freq: Once | INTRAVENOUS | Status: DC

## 2017-11-08 MED ORDER — LACTATED RINGERS IV SOLN
INTRAVENOUS | Status: DC
  Administered 2017-11-08 (×4): via INTRAVENOUS

## 2017-11-08 MED ORDER — ONDANSETRON HCL 4 MG/2ML IJ SOLN: 4.0000 mg | Freq: Four times a day (QID) | INTRAMUSCULAR | Status: DC | PRN

## 2017-11-08 NOTE — Anesthesia Procedure Notes (Signed)
Epidural Patient location during procedure: OB Start time: 11/08/2017 4:55 PM End time: 11/08/2017 4:01 PM  Staffing Anesthesiologist: Cecile Hearingurk, Stephen Edward, MD Performed: anesthesiologist   Preanesthetic Checklist Completed: patient identified, pre-op evaluation, timeout performed, IV checked, risks and benefits discussed and monitors and equipment checked  Epidural Patient position: sitting Prep: DuraPrep Patient monitoring: blood pressure and continuous pulse ox Approach: midline Location: L3-L4 Injection technique: LOR air  Needle:  Needle type: Tuohy  Needle gauge: 17 G Needle length: 9 cm Needle insertion depth: 7 cm Catheter size: 19 Gauge Catheter at skin depth: 13 cm Test dose: negative and Other (1% Lidocaine)  Additional Notes Patient identified.  Risk benefits discussed including failed block, incomplete pain control, headache, nerve damage, paralysis, blood pressure changes, nausea, vomiting, reactions to medication both toxic or allergic, and postpartum back pain.  Patient expressed understanding and wished to proceed.  All questions were answered.  Sterile technique used throughout procedure and epidural site dressed with sterile barrier dressing. No paresthesia or other complications noted. The patient did not experience any signs of intravascular injection such as tinnitus or metallic taste in mouth nor signs of intrathecal spread such as rapid motor block. Please see nursing notes for vital signs. Reason for block:procedure for pain

## 2017-11-08 NOTE — Progress Notes (Signed)
Patient ID: Brandy Castro, female   DOB: 1985/05/09, 32 y.o.   MRN: [id number]  S: feeling UCs  O: BP (!) 111/56   Pulse 77   Temp 97.9 F (36.6 C) (Oral)   Resp 17   Ht 5\' 6"  (1.676 m)   Wt 268 lb 11.2 oz (121.9 kg)   SpO2 100%   BMI 43.37 kg/m  BS nl.  FHT 140s + accels no decels mod variab - cat I TOco reg with pitocin q 3 min  SVE per RN 5.5/ 100%/-1/ Vx   A/P: G1. IOL 39 wks for GDM, LGA suspected  Continue pitocin. Epidural okay. FHT - I GBS(-) Elevated BPs- PEC labs nl  Peanut ball to help rotate and descent  Epidural working well   V.Ihsan Nomura MD

## 2017-11-08 NOTE — Anesthesia Preprocedure Evaluation (Signed)
Anesthesia Evaluation  Patient identified by MRN, date of birth, ID band Patient awake    Reviewed: Allergy & Precautions, NPO status , Patient's Chart, lab work & pertinent test results  Airway Mallampati: II  TM Distance: >3 FB Neck ROM: Full    Dental  (+) Teeth Intact, Dental Advisory Given   Pulmonary former smoker,    Pulmonary exam normal breath sounds clear to auscultation       Cardiovascular Exercise Tolerance: Good negative cardio ROS Normal cardiovascular exam Rhythm:Regular Rate:Normal     Neuro/Psych  Headaches, PSYCHIATRIC DISORDERS Anxiety Depression    GI/Hepatic negative GI ROS, Neg liver ROS,   Endo/Other  diabetes, Gestational, Oral Hypoglycemic AgentsMorbid obesity  Renal/GU negative Renal ROS     Musculoskeletal negative musculoskeletal ROS (+)   Abdominal   Peds  Hematology negative hematology ROS (+) Plt 277k   Anesthesia Other Findings Day of surgery medications reviewed with the patient.  Reproductive/Obstetrics (+) Pregnancy PIH                             Anesthesia Physical Anesthesia Plan  ASA: III  Anesthesia Plan: Epidural   Post-op Pain Management:    Induction:   PONV Risk Score and Plan: 2 and Treatment may vary due to age or medical condition  Airway Management Planned: Natural Airway  Additional Equipment:   Intra-op Plan:   Post-operative Plan:   Informed Consent: I have reviewed the patients History and Physical, chart, labs and discussed the procedure including the risks, benefits and alternatives for the proposed anesthesia with the patient or authorized representative who has indicated his/her understanding and acceptance.   Dental advisory given  Plan Discussed with:   Anesthesia Plan Comments: (Patient identified. Risks/Benefits/Options discussed with patient including but not limited to bleeding, infection, nerve damage,  paralysis, failed block, incomplete pain control, headache, blood pressure changes, nausea, vomiting, reactions to medication both or allergic, itching and postpartum back pain. Confirmed with bedside nurse the patient's most recent platelet count. Confirmed with patient that they are not currently taking any anticoagulation, have any bleeding history or any family history of bleeding disorders. Patient expressed understanding and wished to proceed. All questions were answered. )        Anesthesia Quick Evaluation

## 2017-11-08 NOTE — Progress Notes (Signed)
Patient ID: Kym GroomJaime L Rorke, female   DOB: 07-Dec-1985, 32 y.o.   MRN: [id number]  S: feeling UCs  O: BP (!) 155/88   Pulse 83   Temp 98.4 F (36.9 C) (Oral)   Resp 20   Ht 5\' 6"  (1.676 m)   Wt 268 lb 11.2 oz (121.9 kg)   BMI 43.37 kg/m  BS nl.  FHT 140s + accels no decels mod variab - cat I TOco reg with pitocin q 3 min  SVE 2/ 50%/-5/ Controlled AROM, clear fluid, head well applied to cx after AROM  A/P: G1. IOL 39 wks for GDM, LGA suspected  Continue pitocin. Epidural okay. FHT - I GBS(-) Elevated BPs- PEC labs nl/ Urine p/c pending   V.Satsuki Zillmer MD

## 2017-11-08 NOTE — Anesthesia Pain Management Evaluation Note (Signed)
  CRNA Pain Management Visit Note  Patient: Brandy Castro, 32 y.o., female  "Hello I am a member of the anesthesia team at [facility]. We have an anesthesia team available at all times to provide care throughout the hospital, including epidural management and anesthesia for C-section. I don't know your plan for the delivery whether it a natural birth, water birth, IV sedation, nitrous supplementation, doula or epidural, but we want to meet your pain goals."   1.Was your pain managed to your expectations on prior hospitalizations?   Yes   2.What is your expectation for pain management during this hospitalization?     Epidural  3.How can we help you reach that GOAL? Epidural as appropriate  Record the patient's initial score and the patient's pain goal.   Pain: 3  Pain Goal: 5 [facility] wants you to be able to say your pain was always managed very well.  [name], [name] 11/08/2017
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
## 2017-11-08 NOTE — Progress Notes (Addendum)
Patient ID: Kym GroomJaime L Leathers, female   DOB: December 20, 1985, 32 y.o.   MRN: [id number] Complete, slight rectal pressure intermittently BP (!) 111/56   Pulse 77   Temp 97.9 F (36.6 C) (Oral)   Resp 17   Ht 5\' 6"  (1.676 m)   Wt 268 lb 11.2 oz (121.9 kg)   SpO2 100%   BMI 43.37 kg/m  Epidural great FHT150s + accels + variable decels mod variab. Cat I Toco q 2-3 min SVE complete/ +1/ ROT. FSE placed.  Trial pushes ineffective  recc Left lateral with peanut ball to rotate and then resume pusing in 30 min. LGA.  GBS(-) CBG, BPs stable.

## 2017-11-09 ENCOUNTER — Other Ambulatory Visit: Payer: Self-pay

## 2017-11-09 ENCOUNTER — Encounter (HOSPITAL_COMMUNITY): Payer: Self-pay

## 2017-11-09 DIAGNOSIS — G8918 Other acute postprocedural pain: Secondary | ICD-10-CM | POA: Diagnosis not present

## 2017-11-09 DIAGNOSIS — O9089 Other complications of the puerperium, not elsewhere classified: Secondary | ICD-10-CM

## 2017-11-09 LAB — CBC
HEMATOCRIT: 36.9 % (ref 36.0–46.0)
HEMATOCRIT: 34.4 % — AB (ref 36.0–46.0)
HEMOGLOBIN: 12.4 g/dL (ref 12.0–15.0)
HEMOGLOBIN: 11.9 g/dL — AB (ref 12.0–15.0)
MCH: 30.5 pg (ref 26.0–34.0)
MCH: 31.2 pg (ref 26.0–34.0)
MCHC: 33.6 g/dL (ref 30.0–36.0)
MCHC: 34.6 g/dL (ref 30.0–36.0)
MCV: 90.7 fL (ref 78.0–100.0)
MCV: 90.3 fL (ref 78.0–100.0)
Platelets: 240 10*3/uL (ref 150–400)
Platelets: 237 10*3/uL (ref 150–400)
RBC: 4.07 MIL/uL (ref 3.87–5.11)
RBC: 3.81 MIL/uL — ABNORMAL LOW (ref 3.87–5.11)
RDW: 14.9 % (ref 11.5–15.5)
RDW: 14.8 % (ref 11.5–15.5)
WBC: 18.6 10*3/uL — ABNORMAL HIGH (ref 4.0–10.5)
WBC: 19.5 10*3/uL — ABNORMAL HIGH (ref 4.0–10.5)

## 2017-11-09 LAB — COMPREHENSIVE METABOLIC PANEL
ALBUMIN: 2.6 g/dL — AB (ref 3.5–5.0)
ALK PHOS: 101 U/L (ref 38–126)
ALT: 15 U/L (ref 0–44)
ANION GAP: 10 (ref 5–15)
AST: 21 U/L (ref 15–41)
BILIRUBIN TOTAL: 0.6 mg/dL (ref 0.3–1.2)
BUN: 8 mg/dL (ref 6–20)
CALCIUM: 9 mg/dL (ref 8.9–10.3)
CO2: 18 mmol/L — ABNORMAL LOW (ref 22–32)
Chloride: 106 mmol/L (ref 98–111)
Creatinine, Ser: 0.52 mg/dL (ref 0.44–1.00)
GFR calc Af Amer: >60 mL/min (ref >60)
GLUCOSE: 121 mg/dL — AB (ref 70–99)
Potassium: 3.5 mmol/L (ref 3.5–5.1)
Sodium: 134 mmol/L — ABNORMAL LOW (ref 135–145)
TOTAL PROTEIN: 5.9 g/dL — AB (ref 6.5–8.1)

## 2017-11-09 MED ORDER — BUSPIRONE HCL 5 MG PO TABS
5.0000 mg | ORAL_TABLET | Freq: Two times a day (BID) | ORAL | Status: DC
  Administered 2017-11-09 – 2017-11-10 (×3): 5 mg via ORAL
  Filled 2017-11-09 (×6): qty 1

## 2017-11-09 MED ORDER — LIDOCAINE HCL (PF) 1 % IJ SOLN
INTRAMUSCULAR | Status: AC
  Filled 2017-11-09: qty 30

## 2017-11-09 MED ORDER — ACETAMINOPHEN 325 MG PO TABS
650.0000 mg | ORAL_TABLET | ORAL | Status: DC | PRN
  Administered 2017-11-09: 650 mg via ORAL
  Filled 2017-11-09: qty 2

## 2017-11-09 MED ORDER — SIMETHICONE 80 MG PO CHEW: 80.0000 mg | CHEWABLE_TABLET | ORAL | Status: DC | PRN

## 2017-11-09 MED ORDER — BENZOCAINE-MENTHOL 20-0.5 % EX AERO
1.0000 "application " | INHALATION_SPRAY | CUTANEOUS | Status: DC | PRN
  Administered 2017-11-09 – 2017-11-10 (×2): 1 via TOPICAL
  Filled 2017-11-09 (×2): qty 56

## 2017-11-09 MED ORDER — MAGNESIUM 200 MG PO TABS
1.0000 | ORAL_TABLET | Freq: Two times a day (BID) | ORAL | Status: DC
  Administered 2017-11-09 – 2017-11-10 (×3): 200 mg via ORAL
  Filled 2017-11-09 (×6): qty 1

## 2017-11-09 MED ORDER — DIPHENHYDRAMINE HCL 25 MG PO CAPS: 25.0000 mg | ORAL_CAPSULE | Freq: Four times a day (QID) | ORAL | Status: DC | PRN

## 2017-11-09 MED ORDER — WITCH HAZEL-GLYCERIN EX PADS: 1.0000 "application " | MEDICATED_PAD | CUTANEOUS | Status: DC | PRN

## 2017-11-09 MED ORDER — ONDANSETRON HCL 4 MG/2ML IJ SOLN: 4.0000 mg | INTRAMUSCULAR | Status: DC | PRN

## 2017-11-09 MED ORDER — OXYTOCIN 40 UNITS IN LACTATED RINGERS INFUSION - SIMPLE MED: 2.5000 [IU]/h | INTRAVENOUS | Status: DC

## 2017-11-09 MED ORDER — COCONUT OIL OIL: 1.0000 "application " | TOPICAL_OIL | Status: DC | PRN

## 2017-11-09 MED ORDER — LIDOCAINE HCL (PF) 1 % IJ SOLN
30.0000 mL | INTRAMUSCULAR | Status: DC | PRN
  Filled 2017-11-09: qty 30

## 2017-11-09 MED ORDER — IBUPROFEN 600 MG PO TABS
600.0000 mg | ORAL_TABLET | Freq: Four times a day (QID) | ORAL | Status: DC
  Administered 2017-11-09 – 2017-11-10 (×7): 600 mg via ORAL
  Filled 2017-11-09 (×7): qty 1

## 2017-11-09 MED ORDER — OXYTOCIN BOLUS FROM INFUSION
500.0000 mL | Freq: Once | INTRAVENOUS | Status: AC
  Administered 2017-11-09: 500 mL via INTRAVENOUS

## 2017-11-09 MED ORDER — ONDANSETRON HCL 4 MG PO TABS: 4.0000 mg | ORAL_TABLET | ORAL | Status: DC | PRN

## 2017-11-09 MED ORDER — SENNOSIDES-DOCUSATE SODIUM 8.6-50 MG PO TABS
2.0000 | ORAL_TABLET | ORAL | Status: DC
  Administered 2017-11-10: 2 via ORAL
  Filled 2017-11-09: qty 2

## 2017-11-09 MED ORDER — DIBUCAINE 1 % RE OINT: 1.0000 "application " | TOPICAL_OINTMENT | RECTAL | Status: DC | PRN

## 2017-11-09 MED ORDER — TETANUS-DIPHTH-ACELL PERTUSSIS 5-2.5-18.5 LF-MCG/0.5 IM SUSP: 0.5000 mL | Freq: Once | INTRAMUSCULAR | Status: DC

## 2017-11-09 MED ORDER — PRENATAL MULTIVITAMIN CH
1.0000 | ORAL_TABLET | Freq: Every day | ORAL | Status: DC
  Administered 2017-11-09 – 2017-11-10 (×2): 1 via ORAL
  Filled 2017-11-09 (×2): qty 1

## 2017-11-09 NOTE — Anesthesia Postprocedure Evaluation (Signed)
Anesthesia Post Note  Patient: Brandy Castro  Procedure(s) Performed: AN AD HOC LABOR EPIDURAL     Patient location during evaluation: Mother Baby Anesthesia Type: Epidural Level of consciousness: awake Pain management: pain level controlled Vital Signs Assessment: post-procedure vital signs reviewed and stable Respiratory status: spontaneous breathing Cardiovascular status: stable Postop Assessment: patient able to bend at knees and epidural receding Anesthetic complications: no    Last Vitals:  Vitals:   11/09/17 0300 11/09/17 0724  BP: (!) 100/56 110/71  Pulse: (!) 108 83  Resp: 18 17  Temp:  36.4 C  SpO2:      Last Pain:  Vitals:   11/09/17 0724  TempSrc: Oral  PainSc:    Pain Goal:                 [name],[name]

## 2017-11-09 NOTE — Lactation Note (Signed)
This note was copied from a baby's chart. Lactation Consultation Note  Patient Name: Brandy Wynonia SoursJaime Balcerzak JYNWG'NToday's Date: 11/09/2017 Reason for consult: Initial assessment;Term;Primapara;Maternal endocrine disorder Type of Endocrine Disorder?: Diabetes Breastfeeding consultation services and support information given to patient.  Mom voices feeling insecure with feedings. She is feeling awkward with positioning and latch technique.  Baby is 3312 hours old and she has had successful latch attempts.  Reviewed BF basics and answered questions.  Assisted with positioning baby in football hold on left side.  After a few attempts baby opened wide and latched easily and well.  Mom states latch is comfortable.  Baby fed well with stimulation and breast massage.  FOB present and involved.  Instructed to feed with cues and call for assist prn.  Maternal Data Has patient been taught Hand Expression?: Yes Does the patient have breastfeeding experience prior to this delivery?: No  Feeding Feeding Type: Breast Fed Length of feed: 10 min  LATCH Score Latch: Grasps breast easily, tongue down, lips flanged, rhythmical sucking.  Audible Swallowing: A few with stimulation  Type of Nipple: Everted at rest and after stimulation  Comfort (Breast/Nipple): Soft / non-tender  Hold (Positioning): Assistance needed to correctly position infant at breast and maintain latch.  LATCH Score: 8  Interventions Interventions: Breast feeding basics reviewed;Assisted with latch;Breast compression;Skin to skin;Adjust position;Breast massage;Support pillows;Hand express  Lactation Tools Discussed/Used     Consult Status Consult Status: Follow-up Date: 11/10/17 Follow-up type: In-patient    Huston FoleyMOULDEN, Ozzie Remmers S 11/09/2017, 12:18 PM

## 2017-11-10 LAB — BIRTH TISSUE RECOVERY COLLECTION (PLACENTA DONATION)

## 2017-11-10 MED ORDER — MAGNESIUM OXIDE 250 MG PO TABS: 1.0000 | ORAL_TABLET | Freq: Every day | ORAL | 0 refills | Status: DC

## 2017-11-10 MED ORDER — IBUPROFEN 600 MG PO TABS: 600.0000 mg | ORAL_TABLET | Freq: Four times a day (QID) | ORAL | 0 refills | Status: DC

## 2017-11-10 NOTE — Progress Notes (Signed)
MOB was referred for history of depression/anxiety. * Referral screened out by Clinical Social Worker because none of the following criteria appear to apply: ~ History of anxiety/depression during this pregnancy, or of post-partum depression following prior delivery. ~ Diagnosis of anxiety and/or depression within last 3 years OR * MOB's symptoms currently being treated with medication and/or therapy. Please contact the Clinical Social Worker if needs arise, by MOB request, or if MOB scores greater than 9/yes to question 10 on Edinburgh Postpartum Depression Screen.  Lakisha Peyser Boyd-Gilyard, MSW, LCSW Clinical Social Work [phone]  

## 2017-11-10 NOTE — Discharge Summary (Signed)
OB Discharge Summary  Patient Name: Brandy Castro DOB: 12-19-1985 MRN: [id number]  Date of admission: 11/08/2017 Admitting diagnosis: GDMa2 Intrauterine pregnancy: [redacted]w[redacted]d     Secondary diagnosis: LGA suspect macrosomia  Date of discharge: 11/10/2017    Discharge diagnosis: Term Pregnancy Delivered     Prenatal history: G2P1011   EDC : 11/15/2017, by Other Basis  Prenatal care at [facility]  Primary provider : Mody Prenatal course complicated by GDMA2 / anxiety / LGA / excessive weight gain  Prenatal Labs: ABO, Rh: --/--/O POS, O POS Performed at [facility], [address] [place], [place] [address]  [phone] 0139)  Antibody: NEG (08/07 0139) Rubella: Immune (01/21 0000)   RPR: Non Reactive (08/07 0122)  HBsAg: Negative (01/21 0000)  HIV: Non-reactive (01/21 0000)  GBS: Negative (07/15 0000)                                    Hospital course:  Induction of Labor With Vaginal Delivery   32 y.o. yo G2P1011 at [redacted]w[redacted]d was admitted to the hospital 11/08/2017 for induction of labor.  Indication for induction: A2 DM.  Patient had an uncomplicated labor course as follows: Membrane Rupture Time/Date: 1:20 PM ,11/08/2017   Intrapartum Procedures: Episiotomy: Left Mediolateral [3]                                         Lacerations:     Patient had delivery of a Viable infant.  Information for the patient's newborn:  Jeneal, Vogl Girl Britini [[id number]]  Delivery Method: Vaginal, Spontaneous(Filed from Delivery Summary)   11/09/2017  Details of delivery can be found in separate delivery note.  Patient had a routine postpartum course. Patient is discharged home 11/10/17. Augmentation: AROM and Pitocin Delivering PROVIDER: MODY, VAISHALI                                                            Complications: None  Newborn Data: Live born female  Birth Weight: 7 lb 11.1 oz (3490 g) APGAR: 7, 9  Newborn Delivery   Birth date/time:  11/09/2017 00:00:00 Delivery  type:  Vaginal, Spontaneous     Baby Feeding: Breast Disposition:home with mother  Post partum procedures:none  Labs: Lab Results  Component Value Date   WBC 19.5 (H) 11/09/2017   HGB 11.9 (L) 11/09/2017   HCT 34.4 (L) 11/09/2017   MCV 90.3 11/09/2017   PLT 237 11/09/2017   CMP Latest Ref Rng & Units 11/09/2017  Glucose 70 - 99 mg/dL 086(V)  BUN 6 - 20 mg/dL 8  Creatinine 7.84 - 6.96 mg/dL 2.95  Sodium 284 - 132 mmol/L 134(L)  Potassium 3.5 - 5.1 mmol/L 3.5  Chloride 98 - 111 mmol/L 106  CO2 22 - 32 mmol/L 18(L)  Calcium 8.9 - 10.3 mg/dL 9.0  Total Protein 6.5 - 8.1 g/dL 5.9(L)  Total Bilirubin 0.3 - 1.2 mg/dL 0.6  Alkaline Phos 38 - 126 U/L 101  AST 15 - 41 U/L 21  ALT 0 - 44 U/L 15      Physical Exam @ time of  discharge:  Vitals:   11/09/17 1123 11/09/17 1537 11/09/17 2310 11/10/17 0548  BP: 134/86 121/72 95/65 119/80  Pulse: 90 79 79 84  Resp: 18 16 17 18   Temp: 98.3 F (36.8 C) 98.1 F (36.7 C) 98.4 F (36.9 C) 98.1 F (36.7 C)  TempSrc: Oral Oral Oral Oral  SpO2:      Weight:      Height:        General: alert, cooperative and no distress Lochia: appropriate Uterine Fundus: firm Perineum: repaired LEFT mediolateral episiotomy Incision: Healing well with no significant drainage Extremities: DVT Evaluation: No evidence of DVT seen on physical exam.   Discharge instructions:  "Baby and Me Booklet" and [facility] Booklet  Discharge Medications:  Allergies as of 11/10/2017      Reactions   Sulfonamide Derivatives Hives      Medication List    STOP taking these medications   Magnesium 250 MG Tabs     TAKE these medications   busPIRone 5 MG tablet Commonly known as:  BUSPAR Take 5 mg by mouth 2 (two) times daily.   ibuprofen 600 MG tablet Commonly known as:  ADVIL,MOTRIN Take 1 tablet (600 mg total) by mouth every 6 (six) hours. Start taking on:  11/11/2017   Magnesium Oxide 250 MG Tabs Take 1-2 tablets (250-500 mg total) by mouth daily.    polyethylene glycol packet Commonly known as:  MIRALAX / GLYCOLAX Take 17 g by mouth daily.   PRENATAL/IRON PO Take 1 tablet by mouth daily.       Diet: routine diet  Activity: Advance as tolerated. Pelvic rest x 6 weeks.   Follow up:6 weeks    Signed: Marlinda Mikeanya Rosemond Lyttle CNM, MSN, [facility] 11/10/2017, 11:48 PM

## 2017-11-10 NOTE — Progress Notes (Signed)
PPD1 SVD:   S:  Pt reports feeling  well/ Tolerating po/ Voiding without problems/ No n/v/ Bleeding is moderate/ Pain controlled withprescription NSAID's including motrin  Newborn info BRF   O:  A & O x 3 stable/ VS: Blood pressure 119/80, pulse 84, temperature 98.1 F (36.7 C), temperature source Oral, resp. rate 18, height 5\' 6"  (1.676 m), weight 121.9 kg, SpO2 100 %, unknown if currently breastfeeding.  LABS:  Results for orders placed or performed during the hospital encounter of 11/08/17 (from the past 24 hour(s))  Collect bld for placenta donatation     Status: None   Collection Time: 11/10/17  6:48 AM  Result Value Ref Range   Placenta donation bld collect COLLECTED BY LABORATORY     I&O: I/O last 3 completed shifts: In: -  Out: 1300 [Urine:1100; Blood:200]   No intake/output data recorded.  Lungs: clear to A  Heart: regular rate and rhythm, S1, S2 normal, no murmur, click, rub or gallop  Abdomen: uterus 1 FB above umb. firm  Perineum: healing with good reapproximation  Lochia: mod  Extremities:no redness or tenderness in the calves or thighs, edema 1+    A/P: PPD # 1/ [id number]  Doing well  Continue routine post partum orders  D/c home in am

## 2017-11-10 NOTE — Lactation Note (Signed)
This note was copied from a baby's chart. Lactation Consultation Note  Patient Name: Brandy Castro: 11/10/2017 Reason for consult: Follow-up assessment;Primapara;1st time breastfeeding;Infant weight loss Type of Endocrine Disorder?: Diabetes  Baby is 5339 hours old  LC reviewed and updated the doc flow sheet per mom  Baby awake and showing feeding cues, LC reviewed basics, and minimally assisted with The football position, swallows noted and increased with breast compressions. Baby fed for  10 mins , and started falling asleep and LC had mom release suction.  LC undressed the baby and placed STS for the right breast/ baby was feeding in a consistent pattern  With multiple swallows, and increased with breast compressions, still feeding at 10 mins.  LC discussed nutritive vs non- nutritive feeding patterns and the importance of watching the baby for hanging out  Latched. Also STS feedings until the baby can stay awake for a feeding.  LC noted nipples to be healthy, no breakdown for redness, and mom denies soreness.  LC reviewed hand expressing, and mom able to return demo.  LC instructed mom on the use shells , and hand pump, and #27 F given for when milk comes in.  Sore nipple and engorgement prevention and tx reviewed.  Mom and dad receptive to teaching, asked appropriate breast feeding questions.  And seemed pleased baby was feeding so well and hearing lots of swallows.  Mom expressed she was feeling better about the Breast feeding, latching, and being reassured.  Per mom has a DEBP Spectra 2 at home.  Mother informed of post-discharge support and given phone number to the lactation department, including services for phone call assistance; out-patient appointments; and breastfeeding support group. List of other breastfeeding resources in the community given in the handout. Encouraged mother to call for problems or concerns related to breastfeeding.    Maternal Data Has  patient been taught Hand Expression?: Yes Does the patient have breastfeeding experience prior to this delivery?: No  Feeding Feeding Type: Breast Fed Length of feed: (still feeding at 10 mins , multiple swallows noted )  LATCH Score Latch: Grasps breast easily, tongue down, lips flanged, rhythmical sucking.  Audible Swallowing: Spontaneous and intermittent  Type of Nipple: Everted at rest and after stimulation  Comfort (Breast/Nipple): Filling, red/small blisters or bruises, mild/mod discomfort  Hold (Positioning): Assistance needed to correctly position infant at breast and maintain latch.  LATCH Score: 8  Interventions Interventions: Breast feeding basics reviewed;Assisted with latch;Skin to skin;Hand express;Breast compression;Adjust position;Support pillows;Position options  Lactation Tools Discussed/Used Tools: Shells;Pump;Flanges Flange Size: 24;27;Other (comment)(#27 for when milk comes in ) Shell Type: Inverted Breast pump type: Manual WIC Program: No Pump Review: Setup, frequency, and cleaning;Milk Storage Initiated by:: MAI  Castro initiated:: 11/10/17   Consult Status Consult Status: Complete Castro: 11/10/17 Follow-up type: In-patient    Brandy Castro 11/10/2017, 3:04 PM

## 2018-10-12 ENCOUNTER — Telehealth: Payer: Self-pay

## 2018-10-12 NOTE — Telephone Encounter (Signed)
LM for pt to call back to prescreen for Mondays appt

## 2018-10-12 NOTE — Telephone Encounter (Signed)
Patient return call. ?

## 2018-10-15 ENCOUNTER — Encounter: Payer: Self-pay | Admitting: Gastroenterology

## 2018-10-15 ENCOUNTER — Other Ambulatory Visit (INDEPENDENT_AMBULATORY_CARE_PROVIDER_SITE_OTHER): Payer: No Typology Code available for payment source

## 2018-10-15 ENCOUNTER — Ambulatory Visit (INDEPENDENT_AMBULATORY_CARE_PROVIDER_SITE_OTHER): Payer: No Typology Code available for payment source | Admitting: Gastroenterology

## 2018-10-15 VITALS — Ht 66.0 in | Wt 220.0 lb

## 2018-10-15 DIAGNOSIS — K5909 Other constipation: Secondary | ICD-10-CM

## 2018-10-15 LAB — TSH: TSH: 1.61 u[IU]/mL (ref 0.35–4.50)

## 2018-10-15 MED ORDER — LINACLOTIDE 145 MCG PO CAPS: 145.0000 ug | ORAL_CAPSULE | Freq: Every day | ORAL | 0 refills | Status: DC
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
## 2018-10-15 NOTE — Patient Instructions (Addendum)
If you are age 35 or older, your body mass index should be between 23-30. Your Body mass index is 35.51 kg/m. If this is out of the aforementioned range listed, please consider follow up with your Primary Care Provider.  If you are age 76 or younger, your body mass index should be between 19-25. Your Body mass index is 35.51 kg/m. If this is out of the aformentioned range listed, please consider follow up with your Primary Care Provider.   To help prevent the possible spread of infection to our patients, communities, and staff; we will be implementing the following measures:  As of now we are not allowing any visitors/family members to accompany you to any upcoming appointments with [facility]. If you have any concerns about this please contact our office to discuss prior to the appointment.    We have given you samples of the following medication to take: Linzess 145 mcg and 290 mcg.  Start with the 145 mcg and take once daily in the morning 30 minutes before your first meal.  You may increase to once daily 290 mcg if they 145 mcg does not produce results.  Please go to the lab in the basement of our building to have lab work done as you leave today (TSH). Hit "B" for basement when you get on the elevator.  When the doors open the lab is on your left.  We will call you with the results. Thank you.   Thank you for entrusting me with your care and for choosing [facility], Dr. [place] Cellar

## 2018-10-16 ENCOUNTER — Telehealth: Payer: Self-pay | Admitting: Gastroenterology

## 2018-10-16 NOTE — Telephone Encounter (Signed)
Called patient and read her the note Dr. Havery Moros had sent her via My Chart this morning.

## 2018-11-12 ENCOUNTER — Other Ambulatory Visit: Payer: Self-pay

## 2018-11-12 ENCOUNTER — Encounter (HOSPITAL_COMMUNITY): Payer: Self-pay | Admitting: Emergency Medicine

## 2018-11-12 ENCOUNTER — Inpatient Hospital Stay (HOSPITAL_COMMUNITY)
Admission: EM | Admit: 2018-11-12 | Discharge: 2018-11-15 | DRG: 419 | Disposition: A | Discharge status: Home / Self Care | Payer: PRIVATE HEALTH INSURANCE | Attending: Internal Medicine | Admitting: Internal Medicine

## 2018-11-12 ENCOUNTER — Other Ambulatory Visit (INDEPENDENT_AMBULATORY_CARE_PROVIDER_SITE_OTHER): Payer: No Typology Code available for payment source

## 2018-11-12 ENCOUNTER — Emergency Department (HOSPITAL_COMMUNITY): Payer: PRIVATE HEALTH INSURANCE

## 2018-11-12 DIAGNOSIS — K5909 Other constipation: Secondary | ICD-10-CM | POA: Diagnosis present

## 2018-11-12 DIAGNOSIS — K8067 Calculus of gallbladder and bile duct with acute and chronic cholecystitis with obstruction: Principal | ICD-10-CM | POA: Diagnosis present

## 2018-11-12 DIAGNOSIS — R1011 Right upper quadrant pain: Secondary | ICD-10-CM | POA: Diagnosis not present

## 2018-11-12 DIAGNOSIS — K8050 Calculus of bile duct without cholangitis or cholecystitis without obstruction: Secondary | ICD-10-CM

## 2018-11-12 DIAGNOSIS — H9192 Unspecified hearing loss, left ear: Secondary | ICD-10-CM | POA: Diagnosis present

## 2018-11-12 DIAGNOSIS — F418 Other specified anxiety disorders: Secondary | ICD-10-CM | POA: Diagnosis present

## 2018-11-12 DIAGNOSIS — F32A Depression, unspecified: Secondary | ICD-10-CM | POA: Diagnosis present

## 2018-11-12 DIAGNOSIS — R7989 Other specified abnormal findings of blood chemistry: Secondary | ICD-10-CM | POA: Diagnosis present

## 2018-11-12 DIAGNOSIS — Z8249 Family history of ischemic heart disease and other diseases of the circulatory system: Secondary | ICD-10-CM

## 2018-11-12 DIAGNOSIS — G43909 Migraine, unspecified, not intractable, without status migrainosus: Secondary | ICD-10-CM | POA: Diagnosis present

## 2018-11-12 DIAGNOSIS — Z803 Family history of malignant neoplasm of breast: Secondary | ICD-10-CM

## 2018-11-12 DIAGNOSIS — F329 Major depressive disorder, single episode, unspecified: Secondary | ICD-10-CM | POA: Diagnosis present

## 2018-11-12 DIAGNOSIS — R109 Unspecified abdominal pain: Secondary | ICD-10-CM

## 2018-11-12 DIAGNOSIS — Z841 Family history of disorders of kidney and ureter: Secondary | ICD-10-CM

## 2018-11-12 DIAGNOSIS — Z20828 Contact with and (suspected) exposure to other viral communicable diseases: Secondary | ICD-10-CM | POA: Diagnosis present

## 2018-11-12 DIAGNOSIS — Z800 Family history of malignant neoplasm of digestive organs: Secondary | ICD-10-CM

## 2018-11-12 DIAGNOSIS — Z833 Family history of diabetes mellitus: Secondary | ICD-10-CM

## 2018-11-12 DIAGNOSIS — K219 Gastro-esophageal reflux disease without esophagitis: Secondary | ICD-10-CM

## 2018-11-12 DIAGNOSIS — Z8371 Family history of colonic polyps: Secondary | ICD-10-CM

## 2018-11-12 DIAGNOSIS — K8051 Calculus of bile duct without cholangitis or cholecystitis with obstruction: Secondary | ICD-10-CM

## 2018-11-12 DIAGNOSIS — F419 Anxiety disorder, unspecified: Secondary | ICD-10-CM | POA: Diagnosis present

## 2018-11-12 DIAGNOSIS — Z87891 Personal history of nicotine dependence: Secondary | ICD-10-CM

## 2018-11-12 LAB — URINALYSIS, ROUTINE W REFLEX MICROSCOPIC
Bilirubin Urine: NEGATIVE
Glucose, UA: NEGATIVE mg/dL
Hgb urine dipstick: NEGATIVE
Ketones, ur: NEGATIVE mg/dL
Leukocytes,Ua: NEGATIVE
Nitrite: NEGATIVE
Protein, ur: NEGATIVE mg/dL
Specific Gravity, Urine: 1.003 — ABNORMAL LOW (ref 1.005–1.030)
pH: 8 (ref 5.0–8.0)

## 2018-11-12 LAB — COMPREHENSIVE METABOLIC PANEL
ALT: 767 U/L — ABNORMAL HIGH (ref 0–35)
ALT: 1184 U/L — ABNORMAL HIGH (ref 0–44)
AST: 994 U/L — ABNORMAL HIGH (ref 0–37)
AST: 1478 U/L — ABNORMAL HIGH (ref 15–41)
Albumin: 4.7 g/dL (ref 3.5–5.2)
Albumin: 4.3 g/dL (ref 3.5–5.0)
Alkaline Phosphatase: 171 U/L — ABNORMAL HIGH (ref 39–117)
Alkaline Phosphatase: 212 U/L — ABNORMAL HIGH (ref 38–126)
Anion gap: 12 (ref 5–15)
BUN: 13 mg/dL (ref 6–23)
BUN: 11 mg/dL (ref 6–20)
CO2: 27 mEq/L (ref 19–32)
CO2: 24 mmol/L (ref 22–32)
Calcium: 9.9 mg/dL (ref 8.4–10.5)
Calcium: 9.5 mg/dL (ref 8.9–10.3)
Chloride: 104 mEq/L (ref 96–112)
Chloride: 103 mmol/L (ref 98–111)
Creatinine, Ser: 0.69 mg/dL (ref 0.40–1.20)
Creatinine, Ser: 0.73 mg/dL (ref 0.44–1.00)
GFR calc Af Amer: >60 mL/min (ref >60)
GFR calc non Af Amer: >60 mL/min (ref >60)
GFR: 96.92 mL/min (ref >60.00)
Glucose, Bld: 94 mg/dL (ref 70–99)
Glucose, Bld: 104 mg/dL — ABNORMAL HIGH (ref 70–99)
Potassium: 3.9 mEq/L (ref 3.5–5.1)
Potassium: 3.8 mmol/L (ref 3.5–5.1)
Sodium: 141 mEq/L (ref 135–145)
Sodium: 139 mmol/L (ref 135–145)
Total Bilirubin: 1.4 mg/dL — ABNORMAL HIGH (ref 0.2–1.2)
Total Bilirubin: 2.5 mg/dL — ABNORMAL HIGH (ref 0.3–1.2)
Total Protein: 7.1 g/dL (ref 6.0–8.3)
Total Protein: 7 g/dL (ref 6.5–8.1)

## 2018-11-12 LAB — LIPASE: Lipase: 14 U/L (ref 11.0–59.0)

## 2018-11-12 LAB — CBC WITH DIFFERENTIAL/PLATELET
Basophils Absolute: 0 10*3/uL (ref 0.0–0.1)
Basophils Relative: 0.4 % (ref 0.0–3.0)
Eosinophils Absolute: 0 10*3/uL (ref 0.0–0.7)
Eosinophils Relative: 0.2 % (ref 0.0–5.0)
HCT: 39.4 % (ref 36.0–46.0)
Hemoglobin: 13.3 g/dL (ref 12.0–15.0)
Lymphocytes Relative: 25.4 % (ref 12.0–46.0)
Lymphs Abs: 1.8 10*3/uL (ref 0.7–4.0)
MCHC: 33.8 g/dL (ref 30.0–36.0)
MCV: 88.1 fl (ref 78.0–100.0)
Monocytes Absolute: 0.5 10*3/uL (ref 0.1–1.0)
Monocytes Relative: 7 % (ref 3.0–12.0)
Neutro Abs: 4.8 10*3/uL (ref 1.4–7.7)
Neutrophils Relative %: 67 % (ref 43.0–77.0)
Platelets: 295 10*3/uL (ref 150.0–400.0)
RBC: 4.47 Mil/uL (ref 3.87–5.11)
RDW: 13.1 % (ref 11.5–15.5)
WBC: 7.2 10*3/uL (ref 4.0–10.5)

## 2018-11-12 LAB — CBC
HCT: 41.2 % (ref 36.0–46.0)
Hemoglobin: 13.1 g/dL (ref 12.0–15.0)
MCH: 28.9 pg (ref 26.0–34.0)
MCHC: 31.8 g/dL (ref 30.0–36.0)
MCV: 90.9 fL (ref 80.0–100.0)
Platelets: 342 10*3/uL (ref 150–400)
RBC: 4.53 MIL/uL (ref 3.87–5.11)
RDW: 13 % (ref 11.5–15.5)
WBC: 4.8 10*3/uL (ref 4.0–10.5)
nRBC: 0 % (ref 0.0–0.2)

## 2018-11-12 LAB — LIPASE, BLOOD: Lipase: 27 U/L (ref 11–51)

## 2018-11-12 LAB — I-STAT BETA HCG BLOOD, ED (MC, WL, AP ONLY): I-stat hCG, quantitative: <5 m[IU]/mL (ref <5)

## 2018-11-12 MED ORDER — SODIUM CHLORIDE 0.9% FLUSH
3.0000 mL | Freq: Once | INTRAVENOUS | Status: AC
  Administered 2018-11-13: 3 mL via INTRAVENOUS

## 2018-11-12 MED ORDER — OMEPRAZOLE 20 MG PO CPDR: 20.0000 mg | DELAYED_RELEASE_CAPSULE | Freq: Every day | ORAL | 3 refills | Status: DC

## 2018-11-12 MED ORDER — ONDANSETRON 4 MG PO TBDP: 4.0000 mg | ORAL_TABLET | Freq: Four times a day (QID) | ORAL | 0 refills | Status: DC | PRN

## 2018-11-12 NOTE — ED Triage Notes (Signed)
Pt reports RUQ pain, and emesis that started last night. And has continued today. Tender upon palpation. States she had elevated liver enzymes today when she saw her Dr.

## 2018-11-13 ENCOUNTER — Encounter (HOSPITAL_COMMUNITY): Payer: Self-pay | Admitting: Physician Assistant

## 2018-11-13 ENCOUNTER — Other Ambulatory Visit: Payer: Self-pay

## 2018-11-13 DIAGNOSIS — F418 Other specified anxiety disorders: Secondary | ICD-10-CM | POA: Diagnosis present

## 2018-11-13 DIAGNOSIS — R109 Unspecified abdominal pain: Secondary | ICD-10-CM

## 2018-11-13 DIAGNOSIS — K8033 Calculus of bile duct with acute cholangitis with obstruction: Secondary | ICD-10-CM | POA: Diagnosis not present

## 2018-11-13 DIAGNOSIS — K5909 Other constipation: Secondary | ICD-10-CM | POA: Diagnosis present

## 2018-11-13 DIAGNOSIS — R1011 Right upper quadrant pain: Secondary | ICD-10-CM | POA: Diagnosis present

## 2018-11-13 DIAGNOSIS — K219 Gastro-esophageal reflux disease without esophagitis: Secondary | ICD-10-CM

## 2018-11-13 DIAGNOSIS — K8051 Calculus of bile duct without cholangitis or cholecystitis with obstruction: Secondary | ICD-10-CM | POA: Diagnosis not present

## 2018-11-13 DIAGNOSIS — K810 Acute cholecystitis: Secondary | ICD-10-CM | POA: Diagnosis not present

## 2018-11-13 DIAGNOSIS — Z800 Family history of malignant neoplasm of digestive organs: Secondary | ICD-10-CM | POA: Diagnosis not present

## 2018-11-13 DIAGNOSIS — R945 Abnormal results of liver function studies: Secondary | ICD-10-CM | POA: Diagnosis not present

## 2018-11-13 DIAGNOSIS — Z833 Family history of diabetes mellitus: Secondary | ICD-10-CM | POA: Diagnosis not present

## 2018-11-13 DIAGNOSIS — G43909 Migraine, unspecified, not intractable, without status migrainosus: Secondary | ICD-10-CM | POA: Diagnosis present

## 2018-11-13 DIAGNOSIS — Z8249 Family history of ischemic heart disease and other diseases of the circulatory system: Secondary | ICD-10-CM | POA: Diagnosis not present

## 2018-11-13 DIAGNOSIS — Z20828 Contact with and (suspected) exposure to other viral communicable diseases: Secondary | ICD-10-CM | POA: Diagnosis present

## 2018-11-13 DIAGNOSIS — H9192 Unspecified hearing loss, left ear: Secondary | ICD-10-CM | POA: Diagnosis present

## 2018-11-13 DIAGNOSIS — Z841 Family history of disorders of kidney and ureter: Secondary | ICD-10-CM | POA: Diagnosis not present

## 2018-11-13 DIAGNOSIS — K8067 Calculus of gallbladder and bile duct with acute and chronic cholecystitis with obstruction: Secondary | ICD-10-CM | POA: Diagnosis present

## 2018-11-13 DIAGNOSIS — R7989 Other specified abnormal findings of blood chemistry: Secondary | ICD-10-CM | POA: Diagnosis present

## 2018-11-13 DIAGNOSIS — F419 Anxiety disorder, unspecified: Secondary | ICD-10-CM | POA: Diagnosis not present

## 2018-11-13 DIAGNOSIS — Z87891 Personal history of nicotine dependence: Secondary | ICD-10-CM | POA: Diagnosis not present

## 2018-11-13 DIAGNOSIS — Z8371 Family history of colonic polyps: Secondary | ICD-10-CM | POA: Diagnosis not present

## 2018-11-13 DIAGNOSIS — Z803 Family history of malignant neoplasm of breast: Secondary | ICD-10-CM | POA: Diagnosis not present

## 2018-11-13 LAB — ACETAMINOPHEN LEVEL: Acetaminophen (Tylenol), Serum: <10 ug/mL — ABNORMAL LOW (ref 10–30)

## 2018-11-13 LAB — RAPID URINE DRUG SCREEN, HOSP PERFORMED
Amphetamines: NOT DETECTED
Barbiturates: NOT DETECTED
Benzodiazepines: NOT DETECTED
Cocaine: NOT DETECTED
Opiates: NOT DETECTED
Tetrahydrocannabinol: NOT DETECTED

## 2018-11-13 LAB — COMPREHENSIVE METABOLIC PANEL
ALT: 1019 U/L — ABNORMAL HIGH (ref 0–44)
AST: 570 U/L — ABNORMAL HIGH (ref 15–41)
Albumin: 3.8 g/dL (ref 3.5–5.0)
Alkaline Phosphatase: 234 U/L — ABNORMAL HIGH (ref 38–126)
Anion gap: 9 (ref 5–15)
BUN: 7 mg/dL (ref 6–20)
CO2: 21 mmol/L — ABNORMAL LOW (ref 22–32)
Calcium: 8.4 mg/dL — ABNORMAL LOW (ref 8.9–10.3)
Chloride: 109 mmol/L (ref 98–111)
Creatinine, Ser: 0.72 mg/dL (ref 0.44–1.00)
GFR calc Af Amer: >60 mL/min (ref >60)
GFR calc non Af Amer: >60 mL/min (ref >60)
Glucose, Bld: 99 mg/dL (ref 70–99)
Potassium: 4.1 mmol/L (ref 3.5–5.1)
Sodium: 139 mmol/L (ref 135–145)
Total Bilirubin: 1.6 mg/dL — ABNORMAL HIGH (ref 0.3–1.2)
Total Protein: 6.2 g/dL — ABNORMAL LOW (ref 6.5–8.1)

## 2018-11-13 LAB — PROTIME-INR
INR: 1.1 (ref 0.8–1.2)
Prothrombin Time: 13.6 seconds (ref 11.4–15.2)

## 2018-11-13 LAB — HIV ANTIBODY (ROUTINE TESTING W REFLEX): HIV Screen 4th Generation wRfx: NONREACTIVE

## 2018-11-13 LAB — SARS CORONAVIRUS 2 (TAT 6-24 HRS): SARS Coronavirus 2: NEGATIVE

## 2018-11-13 LAB — APTT: aPTT: 30 seconds (ref 24–36)

## 2018-11-13 MED ORDER — ZOLPIDEM TARTRATE 5 MG PO TABS
5.0000 mg | ORAL_TABLET | Freq: Every evening | ORAL | Status: DC | PRN
  Filled 2018-11-13: qty 1

## 2018-11-13 MED ORDER — SODIUM CHLORIDE 0.9 % IV SOLN
INTRAVENOUS | Status: DC
  Administered 2018-11-13 – 2018-11-14 (×4): via INTRAVENOUS

## 2018-11-13 MED ORDER — SERTRALINE HCL 50 MG PO TABS
50.0000 mg | ORAL_TABLET | Freq: Every day | ORAL | Status: DC
  Administered 2018-11-13 – 2018-11-15 (×2): 50 mg via ORAL
  Filled 2018-11-13 (×2): qty 1

## 2018-11-13 MED ORDER — SODIUM CHLORIDE 0.9 % IV SOLN
Freq: Once | INTRAVENOUS | Status: AC
  Administered 2018-11-13: 03:00:00 via INTRAVENOUS

## 2018-11-13 MED ORDER — DIPHENHYDRAMINE HCL 25 MG PO CAPS
50.0000 mg | ORAL_CAPSULE | Freq: Every evening | ORAL | Status: DC | PRN
  Administered 2018-11-14 (×2): 50 mg via ORAL
  Filled 2018-11-13 (×2): qty 2

## 2018-11-13 MED ORDER — HYDROMORPHONE HCL 1 MG/ML IJ SOLN
1.0000 mg | INTRAMUSCULAR | Status: DC | PRN
  Administered 2018-11-13 – 2018-11-15 (×10): 1 mg via INTRAVENOUS
  Filled 2018-11-13 (×11): qty 1

## 2018-11-13 MED ORDER — BUSPIRONE HCL 5 MG PO TABS
5.0000 mg | ORAL_TABLET | Freq: Two times a day (BID) | ORAL | Status: DC
  Administered 2018-11-13 – 2018-11-15 (×4): 5 mg via ORAL
  Filled 2018-11-13 (×4): qty 1

## 2018-11-13 MED ORDER — BUTALBITAL-APAP-CAFFEINE 50-325-40 MG PO TABS
1.0000 | ORAL_TABLET | Freq: Four times a day (QID) | ORAL | Status: DC | PRN
  Administered 2018-11-13 – 2018-11-15 (×3): 1 via ORAL
  Filled 2018-11-13 (×4): qty 1

## 2018-11-13 MED ORDER — PANTOPRAZOLE SODIUM 40 MG PO TBEC
40.0000 mg | DELAYED_RELEASE_TABLET | Freq: Every day | ORAL | Status: DC
  Administered 2018-11-13 – 2018-11-15 (×2): 40 mg via ORAL
  Filled 2018-11-13 (×2): qty 1

## 2018-11-13 MED ORDER — HYDROMORPHONE HCL 1 MG/ML IJ SOLN
0.5000 mg | Freq: Once | INTRAMUSCULAR | Status: AC
  Administered 2018-11-13: 0.5 mg via INTRAVENOUS
  Filled 2018-11-13: qty 1
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
## 2018-11-13 NOTE — Progress Notes (Signed)
PROGRESS NOTE    Brandy GroomJaime L Signorelli  ZOX:[id number] DOB: October 03, 1985 DOA: 11/12/2018 PCP: Madelin HeadingsPanosh, Wanda K, MD   Brief Narrative: 33 year old female with history of migraine headaches anxiety, depression, gestational diabetes presented to the emergency department with right upper quadrant abdominal pain 3-4 episodes over the last 1 month, has been worse over the past few days.  Patient had episodes of vomiting in the last 2 days.  Work-up in the emergency department patient was found to have AST 1478, ALT 1184, alkaline phosphatase 212, total bilirubin 2.5.  COVID-19 test pending.  Ultrasound of the right upper quadrant showed cholelithiasis, choledocholithiasis with a small stone in the midportion of the common bile duct measuring 1.1 cm.  Gastroenterology is consulted, plan to do ERCP on 11/14/2018.  General surgery is consulted.  ##Choledocholithiasis -Keep the patient on Rocephin, Flagyl considering patient's significant pain, right upper quadrant tenderness -Ultrasound of right upper quadrant confirms choledocholithiasis -GI is consulted, plan to do ERCP on 11/14/2018 -Continue with a clear liquid diet -Keep the patient n.p.o. after midnight  ##Acute cholecystitis -General surgery is consulted -Plan to do laparoscopic cholecystectomy after the ERCP is done  ##Elevated liver enzymes -Secondary to cholestatic choledocholithiasis -We will follow-up LFTs  ##Migraine headache -Keep the patient on Fioricet and follow-up  ##Anxiety depression -Continue to Zoloft, BuSpar   Assessment & Plan:   Principal Problem:   Choledocholithiasis with obstruction Active Problems:   Anxiety   Depression   Elevated LFTs   GERD (gastroesophageal reflux disease)   DVT prophylaxis: SCDs  code Status: (Full Family Communication: Patient and husband  disposition Plan: Home.   Consultants:  Gastroenterology General surgery Procedures:  Antimicrobials: Rocephin  Subjective: Complaining of  severe headache, abdominal pain in the right upper quadrant.  Feels nauseated  Objective: Vitals:   11/13/18 0245 11/13/18 0400 11/13/18 0452 11/13/18 1303  BP: 111/74  (!) 107/57 125/71  Pulse: 63 (!) 56 63 69  Resp:   16 18  Temp:   98.4 F (36.9 C) 97.9 F (36.6 C)  TempSrc:    Oral  SpO2: 99% 97% 99% 100%    Intake/Output Summary (Last 24 hours) at 11/13/2018 1638 Last data filed at 11/13/2018 1512 Gross per 24 hour  Intake 1281.67 ml  Output --  Net 1281.67 ml   There were no vitals filed for this visit.  Examination:  General exam: Appears in mild distress from headache, right upper quadrant pain Respiratory system: Clear to auscultation. Respiratory effort normal. Cardiovascular system: S1 & S2 heard, RRR. No JVD, murmurs, rubs, gallops or clicks. No pedal edema. Gastrointestinal system: Abdomen is nondistended, tenderness in the right upper quadrant, with guarding. No organomegaly or masses felt. Normal bowel sounds heard. Central nervous system: Alert and oriented. No focal neurological deficits. Extremities: Symmetric 5 x 5 power. Skin: No rashes, lesions or ulcers Psychiatry: Judgement and insight appear normal. Mood & affect appropriate.     Data Reviewed: I have personally reviewed following labs and imaging studies  CBC: Recent Labs  Lab 11/12/18 1235 11/12/18 2004  WBC 7.2 4.8  NEUTROABS 4.8  --   HGB 13.3 13.1  HCT 39.4 41.2  MCV 88.1 90.9  PLT 295.0 342   Basic Metabolic Panel: Recent Labs  Lab 11/12/18 1235 11/12/18 2004 11/13/18 1517  NA 141 139 139  K 3.9 3.8 4.1  CL 104 103 109  CO2 27 24 21*  GLUCOSE 94 104* 99  BUN 13 11 7   CREATININE 0.69 0.73 0.72  CALCIUM  9.9 9.5 8.4*   GFR: CrCl cannot be calculated (Unknown ideal weight.). Liver Function Tests: Recent Labs  Lab 11/12/18 1235 11/12/18 2004 11/13/18 1517  AST 994* 1,478* 570*  ALT 767* 1,184* 1,019*  ALKPHOS 171* 212* 234*  BILITOT 1.4* 2.5* 1.6*  PROT 7.1 7.0 6.2*   ALBUMIN 4.7 4.3 3.8   Recent Labs  Lab 11/12/18 1235 11/12/18 2004  LIPASE 14.0 27   No results for input(s): AMMONIA in the last 168 hours. Coagulation Profile: Recent Labs  Lab 11/13/18 0643  INR 1.1   Cardiac Enzymes: No results for input(s): CKTOTAL, CKMB, CKMBINDEX, TROPONINI in the last 168 hours. BNP (last 3 results) No results for input(s): PROBNP in the last 8760 hours. HbA1C: No results for input(s): HGBA1C in the last 72 hours. CBG: No results for input(s): GLUCAP in the last 168 hours. Lipid Profile: No results for input(s): CHOL, HDL, LDLCALC, TRIG, CHOLHDL, LDLDIRECT in the last 72 hours. Thyroid Function Tests: No results for input(s): TSH, T4TOTAL, FREET4, T3FREE, THYROIDAB in the last 72 hours. Anemia Panel: No results for input(s): VITAMINB12, FOLATE, FERRITIN, TIBC, IRON, RETICCTPCT in the last 72 hours. Sepsis Labs: No results for input(s): PROCALCITON, LATICACIDVEN in the last 168 hours.  Recent Results (from the past 240 hour(s))  SARS CORONAVIRUS 2 Nasal Swab Aptima Multi Swab     Status: None   Collection Time: 11/13/18  3:04 AM   Specimen: Aptima Multi Swab; Nasal Swab  Result Value Ref Range Status   SARS Coronavirus 2 NEGATIVE NEGATIVE Final    Comment: (NOTE) SARS-CoV-2 target nucleic acids are NOT DETECTED. The SARS-CoV-2 RNA is generally detectable in upper and lower respiratory specimens during the acute phase of infection. Negative results do not preclude SARS-CoV-2 infection, do not rule out co-infections with other pathogens, and should not be used as the sole basis for treatment or other patient management decisions. Negative results must be combined with clinical observations, patient history, and epidemiological information. The expected result is Negative. Fact Sheet for Patients: HairSlick.nohttps://www.fda.gov/media/138098/download Fact Sheet for Healthcare Providers: quierodirigir.comhttps://www.fda.gov/media/138095/download This test is not yet  approved or cleared by the [place] FDA and  has been authorized for detection and/or diagnosis of SARS-CoV-2 by FDA under an Emergency Use Authorization (EUA). This EUA will remain  in effect (meaning this test can be used) for the duration of the COVID-19 declaration under Section 56 4(b)(1) of the Act, 21 U.S.C. section 360bbb-3(b)(1), unless the authorization is terminated or revoked sooner. Performed at [facility], [address] [place], [place] [address]          Radiology Studies: Kore[place] Abdomen Limited Ruq  Result Date: 11/12/2018 CLINICAL DATA:  Abdominal pain x1 month with increasing pain over the last 24 hours. EXAM: ULTRASOUND ABDOMEN LIMITED RIGHT UPPER QUADRANT COMPARISON:  CT dated October 29, 2009 FINDINGS: Gallbladder: Multiple gallstones are noted. There is no gallbladder wall thickening or pericholecystic free fluid. The sonographic Eulah PontMurphy sign is reported as negative. Common bile duct: Diameter: The common bile duct proximally is dilated measuring approximately 1.1 cm. There is evidence of choledocholithiasis Liver: No focal lesion identified. Within normal limits in parenchymal echogenicity. Portal vein is patent on color Doppler imaging with normal direction of blood flow towards the liver. Other: None. IMPRESSION: 1. Choledocholithiasis with a small gallstone in the midportion of the common bile duct. 2. Cholelithiasis without sonographic evidence for acute cholecystitis. Electronically Signed   By: Katherine Mantlehristopher  Green M.D.   On: 11/12/2018 21:37  Scheduled Meds:  busPIRone  5 mg Oral BID   pantoprazole  40 mg Oral Daily   sertraline  50 mg Oral Daily   Continuous Infusions:  sodium chloride 200 mL/hr at 11/13/18 1422     LOS: 0 days    Time spent: 30 minutes   Amberlin Utke, MD [facility] Pager [phone]  If 7PM-7AM, please contact night-coverage [email or web address] Password [facility] 11/13/2018, 4:38 PM
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
## 2018-11-13 NOTE — Progress Notes (Signed)
PRN Dilaudid worked for patient's headache/migraine for around an hour. Patient now calling out in extreme pain and is on the verge of vomiting. Dr. Lunette Stands paged. Awaiting response/orders for patient.  Hiram Comber, RN 11/13/2018 11:28 AM

## 2018-11-13 NOTE — Progress Notes (Signed)
Patient complaining of extreme headache that is progressing to migraine. Dr. Lunette Stands paged. Awaiting response/orders.    No response received. PRN Dilaudid administered to patient at 0837.

## 2018-11-13 NOTE — Progress Notes (Signed)
Pt arrived to [place] [facility]. Alert and oriented x 4, ambulatory with steady gate. Denied chest pain, SOB, VS stable, no signs of distress, c/o chronic headache 5/10, MD notified.  Admission notified of pt arrival. Pt identified appropriately. Oriented to room and room and equipment, instructed to use call bell for assistance and call bell left within reach.  Will continue to monitor pt.
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
## 2018-11-14 ENCOUNTER — Encounter (HOSPITAL_COMMUNITY)
Admission: EM | Disposition: A | Discharge status: Home / Self Care | Payer: Self-pay | Source: Home / Self Care | Attending: Internal Medicine

## 2018-11-14 ENCOUNTER — Inpatient Hospital Stay (HOSPITAL_COMMUNITY): Payer: PRIVATE HEALTH INSURANCE | Admitting: Anesthesiology

## 2018-11-14 ENCOUNTER — Inpatient Hospital Stay (HOSPITAL_COMMUNITY): Payer: PRIVATE HEALTH INSURANCE

## 2018-11-14 ENCOUNTER — Encounter (HOSPITAL_COMMUNITY): Payer: Self-pay | Admitting: Orthopedic Surgery

## 2018-11-14 DIAGNOSIS — R945 Abnormal results of liver function studies: Secondary | ICD-10-CM

## 2018-11-14 DIAGNOSIS — K810 Acute cholecystitis: Secondary | ICD-10-CM

## 2018-11-14 DIAGNOSIS — R1011 Right upper quadrant pain: Secondary | ICD-10-CM

## 2018-11-14 DIAGNOSIS — K8033 Calculus of bile duct with acute cholangitis with obstruction: Secondary | ICD-10-CM

## 2018-11-14 HISTORY — PX: ERCP: SHX5425

## 2018-11-14 HISTORY — PX: CHOLECYSTECTOMY: SHX55

## 2018-11-14 HISTORY — PX: SPHINCTEROTOMY: SHX5279

## 2018-11-14 LAB — HEPATIC FUNCTION PANEL
ALT: 685 U/L — ABNORMAL HIGH (ref 0–44)
AST: 242 U/L — ABNORMAL HIGH (ref 15–41)
Albumin: 3.2 g/dL — ABNORMAL LOW (ref 3.5–5.0)
Alkaline Phosphatase: 190 U/L — ABNORMAL HIGH (ref 38–126)
Bilirubin, Direct: 0.2 mg/dL (ref 0.0–0.2)
Indirect Bilirubin: 0.7 mg/dL (ref 0.3–0.9)
Total Bilirubin: 0.9 mg/dL (ref 0.3–1.2)
Total Protein: 5.6 g/dL — ABNORMAL LOW (ref 6.5–8.1)

## 2018-11-14 LAB — SURGICAL PCR SCREEN
MRSA, PCR: NEGATIVE
Staphylococcus aureus: NEGATIVE

## 2018-11-14 LAB — HEPATITIS PANEL, ACUTE
HCV Ab: <0.1 s/co ratio (ref 0.0–0.9)
Hep A IgM: NEGATIVE
Hep B C IgM: NEGATIVE
Hepatitis B Surface Ag: NEGATIVE

## 2018-11-14 SURGERY — LAPAROSCOPIC CHOLECYSTECTOMY WITH INTRAOPERATIVE CHOLANGIOGRAM
Anesthesia: General | Site: Abdomen

## 2018-11-14 SURGERY — CANCELLED PROCEDURE

## 2018-11-14 MED ORDER — 0.9 % SODIUM CHLORIDE (POUR BTL) OPTIME
TOPICAL | Status: DC | PRN
  Administered 2018-11-14: 1000 mL

## 2018-11-14 MED ORDER — INDOMETHACIN 50 MG RE SUPP
RECTAL | Status: AC
  Filled 2018-11-14: qty 2

## 2018-11-14 MED ORDER — ACETAMINOPHEN 10 MG/ML IV SOLN: 1000.0000 mg | Freq: Once | INTRAVENOUS | Status: DC | PRN

## 2018-11-14 MED ORDER — SODIUM CHLORIDE 0.9 % IR SOLN
Status: DC | PRN
  Administered 2018-11-14: 1000 mL

## 2018-11-14 MED ORDER — LIDOCAINE 2% (20 MG/ML) 5 ML SYRINGE
INTRAMUSCULAR | Status: AC
  Filled 2018-11-14: qty 5

## 2018-11-14 MED ORDER — DEXAMETHASONE SODIUM PHOSPHATE 10 MG/ML IJ SOLN
INTRAMUSCULAR | Status: AC
  Filled 2018-11-14: qty 1

## 2018-11-14 MED ORDER — DEXAMETHASONE SODIUM PHOSPHATE 10 MG/ML IJ SOLN
INTRAMUSCULAR | Status: DC | PRN
  Administered 2018-11-14: 10 mg via INTRAVENOUS

## 2018-11-14 MED ORDER — OXYCODONE HCL 5 MG PO TABS
5.0000 mg | ORAL_TABLET | Freq: Four times a day (QID) | ORAL | Status: DC | PRN
  Administered 2018-11-14 – 2018-11-15 (×3): 5 mg via ORAL
  Filled 2018-11-14 (×3): qty 1

## 2018-11-14 MED ORDER — KETOROLAC TROMETHAMINE 15 MG/ML IJ SOLN
15.0000 mg | Freq: Three times a day (TID) | INTRAMUSCULAR | Status: DC | PRN
  Administered 2018-11-14: 15 mg via INTRAVENOUS
  Filled 2018-11-14: qty 1

## 2018-11-14 MED ORDER — NON FORMULARY
Status: DC | PRN
  Administered 2018-11-14: 14:00:00 900 mL

## 2018-11-14 MED ORDER — SODIUM CHLORIDE 0.9 % IV SOLN
INTRAVENOUS | Status: DC | PRN
  Administered 2018-11-14: 15:00:00 20 mL

## 2018-11-14 MED ORDER — SUGAMMADEX SODIUM 200 MG/2ML IV SOLN
INTRAVENOUS | Status: DC | PRN
  Administered 2018-11-14: 200 mg via INTRAVENOUS

## 2018-11-14 MED ORDER — FENTANYL CITRATE (PF) 250 MCG/5ML IJ SOLN
INTRAMUSCULAR | Status: AC
  Filled 2018-11-14: qty 5

## 2018-11-14 MED ORDER — ONDANSETRON HCL 4 MG/2ML IJ SOLN
INTRAMUSCULAR | Status: DC | PRN
  Administered 2018-11-14: 4 mg via INTRAVENOUS

## 2018-11-14 MED ORDER — CIPROFLOXACIN IN D5W 400 MG/200ML IV SOLN
INTRAVENOUS | Status: DC | PRN
  Administered 2018-11-14: 400 mg via INTRAVENOUS

## 2018-11-14 MED ORDER — FENTANYL CITRATE (PF) 250 MCG/5ML IJ SOLN
INTRAMUSCULAR | Status: DC | PRN
  Administered 2018-11-14 (×2): 100 ug via INTRAVENOUS
  Administered 2018-11-14: 50 ug via INTRAVENOUS

## 2018-11-14 MED ORDER — SODIUM CHLORIDE 0.9 % IV SOLN
3.0000 g | Freq: Once | INTRAVENOUS | Status: AC
  Administered 2018-11-14: 3 g via INTRAVENOUS
  Filled 2018-11-14: qty 8

## 2018-11-14 MED ORDER — BUPIVACAINE-EPINEPHRINE 0.25% -1:200000 IJ SOLN
INTRAMUSCULAR | Status: DC | PRN
  Administered 2018-11-14: 10 mL

## 2018-11-14 MED ORDER — OXYCODONE HCL 5 MG PO TABS
ORAL_TABLET | ORAL | Status: AC
  Filled 2018-11-14: qty 1

## 2018-11-14 MED ORDER — OXYCODONE HCL 5 MG/5ML PO SOLN: 5.0000 mg | Freq: Once | ORAL | Status: AC | PRN

## 2018-11-14 MED ORDER — PROPOFOL 10 MG/ML IV BOLUS
INTRAVENOUS | Status: DC | PRN
  Administered 2018-11-14: 200 mg via INTRAVENOUS

## 2018-11-14 MED ORDER — ONDANSETRON HCL 4 MG/2ML IJ SOLN
INTRAMUSCULAR | Status: AC
  Filled 2018-11-14: qty 2

## 2018-11-14 MED ORDER — OXYCODONE HCL 5 MG PO TABS
5.0000 mg | ORAL_TABLET | Freq: Once | ORAL | Status: AC | PRN
  Administered 2018-11-14: 5 mg via ORAL

## 2018-11-14 MED ORDER — SODIUM CHLORIDE 0.9 % IV SOLN: INTRAVENOUS | Status: AC

## 2018-11-14 MED ORDER — PROMETHAZINE HCL 25 MG/ML IJ SOLN: 6.2500 mg | INTRAMUSCULAR | Status: DC | PRN

## 2018-11-14 MED ORDER — MUPIROCIN 2 % EX OINT
1.0000 "application " | TOPICAL_OINTMENT | Freq: Two times a day (BID) | CUTANEOUS | Status: DC
  Administered 2018-11-14 (×2): 1 via NASAL
  Filled 2018-11-14: qty 22

## 2018-11-14 MED ORDER — INDOMETHACIN 50 MG RE SUPP
100.0000 mg | Freq: Once | RECTAL | Status: AC
  Administered 2018-11-14: 01:00:00 100 mg via RECTAL
  Filled 2018-11-14: qty 2

## 2018-11-14 MED ORDER — EPHEDRINE SULFATE-NACL 50-0.9 MG/10ML-% IV SOSY
PREFILLED_SYRINGE | INTRAVENOUS | Status: DC | PRN
  Administered 2018-11-14: 10 mg via INTRAVENOUS

## 2018-11-14 MED ORDER — PROPOFOL 10 MG/ML IV BOLUS
INTRAVENOUS | Status: AC
  Filled 2018-11-14: qty 20

## 2018-11-14 MED ORDER — GLUCAGON HCL RDNA (DIAGNOSTIC) 1 MG IJ SOLR
INTRAMUSCULAR | Status: AC
  Filled 2018-11-14: qty 1

## 2018-11-14 MED ORDER — LACTATED RINGERS IV SOLN
INTRAVENOUS | Status: DC
  Administered 2018-11-14 (×2): via INTRAVENOUS

## 2018-11-14 MED ORDER — SCOPOLAMINE 1 MG/3DAYS TD PT72: 1.0000 | MEDICATED_PATCH | Freq: Once | TRANSDERMAL | Status: DC

## 2018-11-14 MED ORDER — ROCURONIUM BROMIDE 10 MG/ML (PF) SYRINGE
PREFILLED_SYRINGE | INTRAVENOUS | Status: AC
  Filled 2018-11-14: qty 10

## 2018-11-14 MED ORDER — MIDAZOLAM HCL 2 MG/2ML IJ SOLN
INTRAMUSCULAR | Status: AC
  Filled 2018-11-14: qty 2

## 2018-11-14 MED ORDER — ROCURONIUM BROMIDE 10 MG/ML (PF) SYRINGE
PREFILLED_SYRINGE | INTRAVENOUS | Status: DC | PRN
  Administered 2018-11-14: 70 mg via INTRAVENOUS

## 2018-11-14 MED ORDER — MIDAZOLAM HCL 5 MG/5ML IJ SOLN
INTRAMUSCULAR | Status: DC | PRN
  Administered 2018-11-14: 2 mg via INTRAVENOUS

## 2018-11-14 MED ORDER — LIDOCAINE 2% (20 MG/ML) 5 ML SYRINGE
INTRAMUSCULAR | Status: DC | PRN
  Administered 2018-11-14: 60 mg via INTRAVENOUS

## 2018-11-14 MED ORDER — HYDROMORPHONE HCL 1 MG/ML IJ SOLN: 0.2500 mg | INTRAMUSCULAR | Status: DC | PRN

## 2018-11-14 MED ORDER — EPHEDRINE 5 MG/ML INJ
INTRAVENOUS | Status: AC
  Filled 2018-11-14: qty 10

## 2018-11-14 MED ORDER — BUPIVACAINE-EPINEPHRINE (PF) 0.25% -1:200000 IJ SOLN
INTRAMUSCULAR | Status: AC
  Filled 2018-11-14: qty 30

## 2018-11-14 MED ORDER — INDOMETHACIN 50 MG RE SUPP
RECTAL | Status: DC | PRN
  Administered 2018-11-14: 100 mg via RECTAL

## 2018-11-14 SURGICAL SUPPLY — 41 items
APPLIER CLIP 5 13 M/L LIGAMAX5 (MISCELLANEOUS) ×4
BLADE CLIPPER SURG (BLADE) ×2 IMPLANT
CANISTER SUCT 3000ML PPV (MISCELLANEOUS) ×4 IMPLANT
CHLORAPREP W/TINT 26 (MISCELLANEOUS) ×4 IMPLANT
CLIP APPLIE 5 13 M/L LIGAMAX5 (MISCELLANEOUS) ×2 IMPLANT
CLOSURE WOUND 1/2 X4 (GAUZE/BANDAGES/DRESSINGS) ×1
COVER MAYO STAND STRL (DRAPES) IMPLANT
COVER SURGICAL LIGHT HANDLE (MISCELLANEOUS) ×4 IMPLANT
COVER WAND RF STERILE (DRAPES) ×4 IMPLANT
DERMABOND ADVANCED (GAUZE/BANDAGES/DRESSINGS) ×2
DERMABOND ADVANCED .7 DNX12 (GAUZE/BANDAGES/DRESSINGS) ×2 IMPLANT
DRAPE C-ARM 42X72 X-RAY (DRAPES) IMPLANT
ELECT REM PT RETURN 9FT ADLT (ELECTROSURGICAL) ×4
ELECTRODE REM PT RTRN 9FT ADLT (ELECTROSURGICAL) ×2 IMPLANT
GLOVE BIO SURGEON STRL SZ7 (GLOVE) ×4 IMPLANT
GLOVE BIOGEL PI IND STRL 7.5 (GLOVE) ×2 IMPLANT
GLOVE BIOGEL PI INDICATOR 7.5 (GLOVE) ×2
GOWN STRL REUS W/ TWL LRG LVL3 (GOWN DISPOSABLE) ×6 IMPLANT
GOWN STRL REUS W/TWL LRG LVL3 (GOWN DISPOSABLE) ×10
GRASPER SUT TROCAR 14GX15 (MISCELLANEOUS) ×4 IMPLANT
KIT BASIN OR (CUSTOM PROCEDURE TRAY) ×4 IMPLANT
KIT TURNOVER KIT B (KITS) ×4 IMPLANT
NS IRRIG 1000ML POUR BTL (IV SOLUTION) ×4 IMPLANT
PAD ARMBOARD 7.5X6 YLW CONV (MISCELLANEOUS) ×4 IMPLANT
POUCH RETRIEVAL ECOSAC 10 (ENDOMECHANICALS) ×2 IMPLANT
POUCH RETRIEVAL ECOSAC 10MM (ENDOMECHANICALS) ×2
SCISSORS LAP 5X35 DISP (ENDOMECHANICALS) ×4 IMPLANT
SET CHOLANGIOGRAPH 5 50 .035 (SET/KITS/TRAYS/PACK) IMPLANT
SET IRRIG TUBING LAPAROSCOPIC (IRRIGATION / IRRIGATOR) ×4 IMPLANT
SET TUBE SMOKE EVAC HIGH FLOW (TUBING) ×4 IMPLANT
SLEEVE ENDOPATH XCEL 5M (ENDOMECHANICALS) ×8 IMPLANT
SPECIMEN JAR SMALL (MISCELLANEOUS) ×4 IMPLANT
STRIP CLOSURE SKIN 1/2X4 (GAUZE/BANDAGES/DRESSINGS) ×3 IMPLANT
SUT MNCRL AB 4-0 PS2 18 (SUTURE) ×4 IMPLANT
SUT VICRYL 0 UR6 27IN ABS (SUTURE) ×4 IMPLANT
TOWEL GREEN STERILE (TOWEL DISPOSABLE) ×4 IMPLANT
TOWEL GREEN STERILE FF (TOWEL DISPOSABLE) ×4 IMPLANT
TRAY LAPAROSCOPIC MC (CUSTOM PROCEDURE TRAY) ×4 IMPLANT
TROCAR XCEL BLUNT TIP 100MML (ENDOMECHANICALS) ×4 IMPLANT
TROCAR XCEL NON-BLD 5MMX100MML (ENDOMECHANICALS) ×4 IMPLANT
WATER STERILE IRR 1000ML POUR (IV SOLUTION) ×4 IMPLANT

## 2018-11-14 NOTE — Transfer of Care (Signed)
Immediate Anesthesia Transfer of Care Note  Patient: Brandy Castro  Procedure(s) Performed: LAPAROSCOPIC CHOLECYSTECTOMY (N/A Abdomen) Endoscopic Retrograde Cholangiopancreatography (Ercp) Sphincterotomy  Patient Location: PACU  Anesthesia Type:General  Level of Consciousness: awake, alert , oriented and patient cooperative  Airway & Oxygen Therapy: Patient Spontanous Breathing and Patient connected to face mask oxygen  Post-op Assessment: Report given to RN, Post -op Vital signs reviewed and stable and Patient moving all extremities  Post vital signs: Reviewed and stable  Last Vitals:  Vitals Value Taken Time  BP 143/95 11/14/18 1533  Temp    Pulse 96 11/14/18 1536  Resp 14 11/14/18 1536  SpO2 99 % 11/14/18 1536  Vitals shown include unvalidated device data.  Last Pain:  Vitals:   11/14/18 0907  TempSrc:   PainSc: Asleep         Complications: No apparent anesthesia complications

## 2018-11-14 NOTE — Progress Notes (Signed)
PROGRESS NOTE    Brandy Castro  WUJ:[id number] DOB: Apr 25, 1985 DOA: 11/12/2018 PCP: Madelin HeadingsPanosh, Wanda K, MD   Brief Narrative: 33 year old female with history of migraine headaches anxiety, depression, gestational diabetes presented to the emergency department with right upper quadrant abdominal pain 3-4 episodes over the last 1 month, has been worse over the past few days.  Patient had episodes of vomiting in the last 2 days.  Work-up in the emergency department patient was found to have AST 1478, ALT 1184, alkaline phosphatase 212, total bilirubin 2.5.  COVID-19 test pending.  Ultrasound of the right upper quadrant showed cholelithiasis, choledocholithiasis with a small stone in the midportion of the common bile duct measuring 1.1 cm.  Gastroenterology is consulted, plan to do ERCP on 11/14/2018.  General surgery is consulted.  ##Choledocholithiasis -Ultrasound of right upper quadrant confirms choledocholithiasis -GI is consulted, plan to do ERCP on 11/14/2018 -NPO -Patient has improvement of LFTs, patient might have passed the stone  ##Acute cholecystitis -General surgery is consulted -Plan to do laparoscopic cholecystectomy after the ERCP is done  ##Elevated liver enzymes -Secondary to cholestatic choledocholithiasis -Continuous improvement since the admission -Hepatitis panel negative  ##Migraine headache -Keep the patient on Fioricet and follow-up -Improved  ##Anxiety depression -Continue to Zoloft, BuSpar   Assessment & Plan:   Principal Problem:   Choledocholithiasis with obstruction Active Problems:   Anxiety   Depression   Elevated LFTs   GERD (gastroesophageal reflux disease)   Abdominal pain   DVT prophylaxis: SCDs  code Status: (Full Family Communication: Patient and husband  disposition Plan: Home.   Consultants:  Gastroenterology General surgery Procedures:  Antimicrobials: Rocephin  Subjective: Improved headache, some abdominal pain   Objective: Vitals:   11/13/18 1303 11/13/18 2145 11/14/18 0642 11/14/18 1149  BP: 125/71 115/71 114/70   Pulse: 69 (!) 54 61   Resp: 18 17 18    Temp: 97.9 F (36.6 C) 99 F (37.2 C) 98.9 F (37.2 C)   TempSrc: Oral     SpO2: 100% 100% 99%   Height:    (P) 5\' 6"  (1.676 m)    Intake/Output Summary (Last 24 hours) at 11/14/2018 1429 Last data filed at 11/14/2018 0300 Gross per 24 hour  Intake 2296.67 ml  Output 1053 ml  Net 1243.67 ml   There were no vitals filed for this visit.  Examination:  General exam: Well-built, well-nourished female not in distress Respiratory system: Clear to auscultation. Respiratory effort normal. Cardiovascular system: S1 & S2 heard, RRR. No JVD, murmurs, rubs, gallops or clicks. No pedal edema. Gastrointestinal system: Abdomen is nondistended, tenderness in the right upper quadrant, with guarding. No organomegaly or masses felt. Normal bowel sounds heard. Central nervous system: Alert and oriented. No focal neurological deficits. Extremities: Symmetric 5 x 5 power. Skin: No rashes, lesions or ulcers Psychiatry: Judgement and insight appear normal. Mood & affect appropriate.     Data Reviewed: I have personally reviewed following labs and imaging studies  CBC: Recent Labs  Lab 11/12/18 1235 11/12/18 2004  WBC 7.2 4.8  NEUTROABS 4.8  --   HGB 13.3 13.1  HCT 39.4 41.2  MCV 88.1 90.9  PLT 295.0 342   Basic Metabolic Panel: Recent Labs  Lab 11/12/18 1235 11/12/18 2004 11/13/18 1517  NA 141 139 139  K 3.9 3.8 4.1  CL 104 103 109  CO2 27 24 21*  GLUCOSE 94 104* 99  BUN 13 11 7   CREATININE 0.69 0.73 0.72  CALCIUM 9.9 9.5 8.4*  GFR: CrCl cannot be calculated (Unknown ideal weight.). Liver Function Tests: Recent Labs  Lab 11/12/18 1235 11/12/18 2004 11/13/18 1517 11/14/18 0255  AST 994* 1,478* 570* 242*  ALT 767* 1,184* 1,019* 685*  ALKPHOS 171* 212* 234* 190*  BILITOT 1.4* 2.5* 1.6* 0.9  PROT 7.1 7.0 6.2* 5.6*  ALBUMIN  4.7 4.3 3.8 3.2*   Recent Labs  Lab 11/12/18 1235 11/12/18 2004  LIPASE 14.0 27   No results for input(s): AMMONIA in the last 168 hours. Coagulation Profile: Recent Labs  Lab 11/13/18 0643  INR 1.1   Cardiac Enzymes: No results for input(s): CKTOTAL, CKMB, CKMBINDEX, TROPONINI in the last 168 hours. BNP (last 3 results) No results for input(s): PROBNP in the last 8760 hours. HbA1C: No results for input(s): HGBA1C in the last 72 hours. CBG: No results for input(s): GLUCAP in the last 168 hours. Lipid Profile: No results for input(s): CHOL, HDL, LDLCALC, TRIG, CHOLHDL, LDLDIRECT in the last 72 hours. Thyroid Function Tests: No results for input(s): TSH, T4TOTAL, FREET4, T3FREE, THYROIDAB in the last 72 hours. Anemia Panel: No results for input(s): VITAMINB12, FOLATE, FERRITIN, TIBC, IRON, RETICCTPCT in the last 72 hours. Sepsis Labs: No results for input(s): PROCALCITON, LATICACIDVEN in the last 168 hours.  Recent Results (from the past 240 hour(s))  SARS CORONAVIRUS 2 Nasal Swab Aptima Multi Swab     Status: None   Collection Time: 11/13/18  3:04 AM   Specimen: Aptima Multi Swab; Nasal Swab  Result Value Ref Range Status   SARS Coronavirus 2 NEGATIVE NEGATIVE Final    Comment: (NOTE) SARS-CoV-2 target nucleic acids are NOT DETECTED. The SARS-CoV-2 RNA is generally detectable in upper and lower respiratory specimens during the acute phase of infection. Negative results do not preclude SARS-CoV-2 infection, do not rule out co-infections with other pathogens, and should not be used as the sole basis for treatment or other patient management decisions. Negative results must be combined with clinical observations, patient history, and epidemiological information. The expected result is Negative. Fact Sheet for Patients: SugarRoll.be Fact Sheet for Healthcare Providers: https://www.woods-mathews.com/ This test is not yet approved  or cleared by the [place] FDA and  has been authorized for detection and/or diagnosis of SARS-CoV-2 by FDA under an Emergency Use Authorization (EUA). This EUA will remain  in effect (meaning this test can be used) for the duration of the COVID-19 declaration under Section 56 4(b)(1) of the Act, 21 U.S.C. section 360bbb-3(b)(1), unless the authorization is terminated or revoked sooner. Performed at [facility], [place] [address], [place], [place] [address]   Surgical PCR screen     Status: None   Collection Time: 11/14/18  2:00 AM   Specimen: Nasal Mucosa; Nasal Swab  Result Value Ref Range Status   MRSA, PCR NEGATIVE NEGATIVE Final   Staphylococcus aureus NEGATIVE NEGATIVE Final    Comment: (NOTE) The Xpert SA Assay (FDA approved for NASAL specimens in patients 51 years of age and older), is one component of a comprehensive surveillance program. It is not intended to diagnose infection nor to guide or monitor treatment. Performed at [facility], [place] [address] [place], [place] [address]          Radiology Studies: US Abdomen Limited Ruq  Result Date: 11/12/2018 CLINICAL DATA:  Abdominal pain x1 month with increasing pain over the last 24 hours. EXAM: ULTRASOUND ABDOMEN LIMITED RIGHT UPPER QUADRANT COMPARISON:  CT dated October 29, 2009 FINDINGS: Gallbladder: Multiple gallstones are noted. There is  no gallbladder wall thickening or pericholecystic free fluid. The sonographic Eulah PontMurphy sign is reported as negative. Common bile duct: Diameter: The common bile duct proximally is dilated measuring approximately 1.1 cm. There is evidence of choledocholithiasis Liver: No focal lesion identified. Within normal limits in parenchymal echogenicity. Portal vein is patent on color Doppler imaging with normal direction of blood flow towards the liver. Other: None. IMPRESSION: 1. Choledocholithiasis with a small gallstone in the midportion of the common bile duct. 2. Cholelithiasis  without sonographic evidence for acute cholecystitis. Electronically Signed   By: Katherine Mantlehristopher  Green M.D.   On: 11/12/2018 21:37        Scheduled Meds: . [MAR Hold] busPIRone  5 mg Oral BID  . [MAR Hold] pantoprazole  40 mg Oral Daily  . scopolamine  1 patch Transdermal Once  . [MAR Hold] sertraline  50 mg Oral Daily   Continuous Infusions: . sodium chloride 200 mL/hr at 11/14/18 0405  . lactated ringers 10 mL/hr at 11/14/18 1217     LOS: 1 day    Time spent: 30 minutes   Semaya Vida, MD [facility] Pager [phone]  If 7PM-7AM, please contact night-coverage [email or web address] Password [email or web address] 11/14/2018, 2:29 PM

## 2018-11-14 NOTE — Interval H&P Note (Signed)
History and Physical Interval Note:  11/14/2018 1:18 PM  Brandy Castro  has presented today for surgery, with the diagnosis of cholecystitis..  The various methods of treatment have been discussed with the patient and family. After consideration of risks, benefits and other options for treatment, the patient has consented to  Procedure(s): LAPAROSCOPIC CHOLECYSTECTOMY (N/A) Endoscopic Retrograde Cholangiopancreatography (Ercp) as a surgical intervention.  The patient's history has been reviewed, patient examined, no change in status, stable for surgery.  I have reviewed the patient's chart and labs.  Questions were answered to the patient's satisfaction.     Silvano Rusk

## 2018-11-14 NOTE — Progress Notes (Signed)
Patient returned to unit from PACU. Alert and oriented x 4. IVF restarted. Lap sites clean, dry and intact. Complaining of 8/10 abdominal pain. PRN Dilaudid administered. Denies any other complaints. Husband at bedside. Will continue to monitor.  Hiram Comber, RN 11/14/2018 6:03 PM

## 2018-11-14 NOTE — Progress Notes (Signed)
   Subjective/Chief Complaint: Some epigastric and ruq pain today   Objective: Vital signs in last 24 hours: Temp:  [97.9 F (36.6 C)-99 F (37.2 C)] 98.9 F (37.2 C) (08/12 0642) Pulse Rate:  [54-69] 61 (08/12 0642) Resp:  [17-18] 18 (08/12 0642) BP: (114-125)/(70-71) 114/70 (08/12 0642) SpO2:  [99 %-100 %] 99 % (08/12 0642) Last BM Date: 11/11/18  Intake/Output from previous day: 08/11 0701 - 08/12 0700 In: 2376.7 [P.O.:80; I.V.:2296.7] Out: 1053 [Urine:1053] Intake/Output this shift: No intake/output data recorded.  GI: mild tender ruq/epigastrium, no murphys sign  Lab Results:  Recent Labs    11/12/18 1235 11/12/18 2004  WBC 7.2 4.8  HGB 13.3 13.1  HCT 39.4 41.2  PLT 295.0 342   BMET Recent Labs    11/12/18 2004 11/13/18 1517  NA 139 139  K 3.8 4.1  CL 103 109  CO2 24 21*  GLUCOSE 104* 99  BUN 11 7  CREATININE 0.73 0.72  CALCIUM 9.5 8.4*   PT/INR Recent Labs    11/13/18 0643  LABPROT 13.6  INR 1.1   ABG No results for input(s): PHART, HCO3 in the last 72 hours.  Invalid input(s): PCO2, PO2  Studies/Results: US Abdomen Limited Ruq  Result Date: 11/12/2018 CLINICAL DATA:  Abdominal pain x1 month with increasing pain over the last 24 hours. EXAM: ULTRASOUND ABDOMEN LIMITED RIGHT UPPER QUADRANT COMPARISON:  CT dated October 29, 2009 FINDINGS: Gallbladder: Multiple gallstones are noted. There is no gallbladder wall thickening or pericholecystic free fluid. The sonographic Percell Miller sign is reported as negative. Common bile duct: Diameter: The common bile duct proximally is dilated measuring approximately 1.1 cm. There is evidence of choledocholithiasis Liver: No focal lesion identified. Within normal limits in parenchymal echogenicity. Portal vein is patent on color Doppler imaging with normal direction of blood flow towards the liver. Other: None. IMPRESSION: 1. Choledocholithiasis with a small gallstone in the midportion of the common bile duct. 2.  Cholelithiasis without sonographic evidence for acute cholecystitis. Electronically Signed   By: Constance Holster M.D.   On: 11/12/2018 21:37    Anti-infectives: Anti-infectives (From admission, onward)   Start     Dose/Rate Route Frequency Ordered Stop   11/14/18 0115  Ampicillin-Sulbactam (UNASYN) 3 g in sodium chloride 0.9 % 100 mL IVPB     3 g 200 mL/hr over 30 Minutes Intravenous  Once 11/14/18 0102 11/14/18 0153      Assessment/Plan: Choledocholithiasis Biliary colic -labs better, plan is for ercp later today -I discussed if ERCP goes well without issues will plan on following immediately with lap chole in the OR.  Discussed if any issues will delay -procedure discussed yesterday  Rolm Bookbinder 11/14/2018

## 2018-11-14 NOTE — Plan of Care (Signed)

## 2018-11-14 NOTE — Anesthesia Postprocedure Evaluation (Signed)
Anesthesia Post Note  Patient: Brandy Castro  Procedure(s) Performed: LAPAROSCOPIC CHOLECYSTECTOMY (N/A Abdomen) Endoscopic Retrograde Cholangiopancreatography (Ercp) Sphincterotomy     Patient location during evaluation: PACU Anesthesia Type: General Level of consciousness: sedated Pain management: pain level controlled Vital Signs Assessment: post-procedure vital signs reviewed and stable Respiratory status: spontaneous breathing and respiratory function stable Cardiovascular status: stable Postop Assessment: no apparent nausea or vomiting Anesthetic complications: no    Last Vitals:  Vitals:   11/14/18 1535 11/14/18 1550  BP: (!) 143/95 133/83  Pulse: 99 72  Resp: 12 (!) 9  Temp: (!) 36.2 C   SpO2: 100% 97%    Last Pain:  Vitals:   11/14/18 1655  TempSrc:   PainSc: 2                  Sita Mangen DANIEL

## 2018-11-14 NOTE — Anesthesia Preprocedure Evaluation (Addendum)
Anesthesia Evaluation  Patient identified by MRN, date of birth, ID band Patient awake    Reviewed: Allergy & Precautions, NPO status , Patient's Chart, lab work & pertinent test results  History of Anesthesia Complications Negative for: history of anesthetic complications  Airway Mallampati: I  TM Distance: >3 FB Neck ROM: Full    Dental no notable dental hx.    Pulmonary former smoker,    Pulmonary exam normal        Cardiovascular negative cardio ROS Normal cardiovascular exam     Neuro/Psych  Headaches, Anxiety Depression    GI/Hepatic GERD  Controlled,(+) Hepatitis -Choledocholithiasis with obstruction   Endo/Other  negative endocrine ROS  Renal/GU negative Renal ROS     Musculoskeletal negative musculoskeletal ROS (+)   Abdominal   Peds  Hematology negative hematology ROS (+)   Anesthesia Other Findings Day of surgery medications reviewed with the patient.  Reproductive/Obstetrics                            Anesthesia Physical Anesthesia Plan  ASA: II  Anesthesia Plan: General   Post-op Pain Management:    Induction: Intravenous  PONV Risk Score and Plan: 3 and Treatment may vary due to age or medical condition, Ondansetron, Dexamethasone, Midazolam and Scopolamine patch - Pre-op  Airway Management Planned: Oral ETT  Additional Equipment:   Intra-op Plan:   Post-operative Plan: Extubation in OR  Informed Consent: I have reviewed the patients History and Physical, chart, labs and discussed the procedure including the risks, benefits and alternatives for the proposed anesthesia with the patient or authorized representative who has indicated his/her understanding and acceptance.     Dental advisory given  Plan Discussed with: CRNA  Anesthesia Plan Comments:        Anesthesia Quick Evaluation

## 2018-11-14 NOTE — Discharge Instructions (Signed)
[facility] ° °Please arrive at least 30 min before your appointment to complete your check in paperwork.  If you are unable to arrive 30 min prior to your appointment time we may have to cancel or reschedule you. °LAPAROSCOPIC SURGERY: POST OP INSTRUCTIONS °Always review your discharge instruction sheet given to you by the facility where your surgery was performed. °IF YOU HAVE DISABILITY OR FAMILY LEAVE FORMS, YOU MUST BRING THEM TO THE OFFICE FOR PROCESSING.   °DO NOT GIVE THEM TO YOUR DOCTOR. ° °PAIN CONTROL ° °1. First take acetaminophen (Tylenol) AND/or ibuprofen (Advil) to control your pain after surgery.  Follow directions on package.  Taking acetaminophen (Tylenol) and/or ibuprofen (Advil) regularly after surgery will help to control your pain and lower the amount of prescription pain medication you may need.  You should not take more than 4,000 mg (4 grams) of acetaminophen (Tylenol) in 24 hours.  You should not take ibuprofen (Advil), aleve, motrin, naprosyn or other NSAIDS if you have a history of stomach ulcers or chronic kidney disease.  °2. A prescription for pain medication may be given to you upon discharge.  Take your pain medication as prescribed, if you still have uncontrolled pain after taking acetaminophen (Tylenol) or ibuprofen (Advil). °3. Use ice packs to help control pain. °4. If you need a refill on your pain medication, please contact your pharmacy.  They will contact our office to request authorization. Prescriptions will not be filled after 5pm or on week-ends. ° °HOME MEDICATIONS °5. Take your usually prescribed medications unless otherwise directed. ° °DIET °6. You should follow a light diet the first few days after arrival home.  Be sure to include lots of fluids daily. Avoid fatty, fried foods.  ° °CONSTIPATION °7. It is common to experience some constipation after surgery and if you are taking pain medication.  Increasing fluid intake and taking a stool  softener (such as Colace) will usually help or prevent this problem from occurring.  A mild laxative (Milk of Magnesia or Miralax) should be taken according to package instructions if there are no bowel movements after 48 hours. ° °WOUND/INCISION CARE °8. Most patients will experience some swelling and bruising in the area of the incisions.  Ice packs will help.  Swelling and bruising can take several days to resolve.  °9. Unless discharge instructions indicate otherwise, follow guidelines below  °a. STERI-STRIPS - you may remove your outer bandages 48 hours after surgery, and you may shower at that time.  You have steri-strips (small skin tapes) in place directly over the incision.  These strips should be left on the skin for 7-10 days.   °b. DERMABOND/SKIN GLUE - you may shower in 24 hours.  The glue will flake off over the next 2-3 weeks. °10. Any sutures or staples will be removed at the office during your follow-up visit. ° °ACTIVITIES °11. You may resume regular (light) daily activities beginning the next day--such as daily self-care, walking, climbing stairs--gradually increasing activities as tolerated.  You may have sexual intercourse when it is comfortable.  Refrain from any heavy lifting or straining until approved by your doctor. °a. You may drive when you are no longer taking prescription pain medication, you can comfortably wear a seatbelt, and you can safely maneuver your car and apply brakes. ° °FOLLOW-UP °12. You should see your doctor in the office for a follow-up appointment approximately 2-3 weeks after your surgery.  You should have been given your post-op/follow-up appointment when   your surgery was scheduled.  If you did not receive a post-op/follow-up appointment, make sure that you call for this appointment within a day or two after you arrive home to insure a convenient appointment time. ° °OTHER INSTRUCTIONS ° °WHEN TO CALL YOUR DOCTOR: °1. Fever over 101.0 °2. Inability to  urinate °3. Continued bleeding from incision. °4. Increased pain, redness, or drainage from the incision. °5. Increasing abdominal pain ° °The clinic staff is available to answer your questions during regular business hours.  Please don’t hesitate to call and ask to speak to one of the nurses for clinical concerns.  If you have a medical emergency, go to the nearest emergency room or call 911.  A surgeon from [facility] is always on call at the hospital. °[address], [address], [place], [place]  [address] ? [address] [address], [place], [place]   [address] °[phone] ? [phone] ? FAX [phone] ° ° ° °

## 2018-11-14 NOTE — Brief Op Note (Signed)
11/12/2018 - 11/14/2018  2:34 PM  PATIENT:  Brandy Castro  33 y.o. female  PRE-OPERATIVE DIAGNOSIS:  cholecystitis, common bile duct stone  POST-OPERATIVE DIAGNOSIS:  cholecystitis, common bile duct stone  PROCEDURE:  Procedure(s): LAPAROSCOPIC CHOLECYSTECTOMY (N/A) Endoscopic Retrograde Cholangiopancreatography (Ercp)  SURGEON:  Surgeon(s) and Role: Panel 1:    Rolm Bookbinder, MD - Primary Panel 2:    Gatha Mayer, MD - Primary   ANESTHESIA:   General  EBL:  None   Cipro 400 mg IV on call  Findings:  Sl dilated CBD at 8-9 mm max, GB did not fill. Given + [place] for CBD stone bile duct swept with balloon several x after wire-guided biliary sphincterotomy.  Small amount of sludge out.  No discrete stone so original stone may have passed or was compressed into sludge w/ balloon.  Full note to follow  Gatha Mayer, MD, [facility] 11/14/2018 2:37 PM Pager [phone]

## 2018-11-14 NOTE — Op Note (Signed)
Preoperative diagnosis: 1.  Choledocholithiasis 2.  Symptomatic cholelithiasis Postoperative diagnosis: Same as above Chronic cholecystitis Procedure: Laparoscopic cholecystectomy Surgeon: Dr. Serita Grammes Assistant: Margie Billet, PA-C Anesthesia: General Specimens: Gallbladder and contents to pathology Complications: None Drains: None Sponge needle count was correct at completion Disposition to recovery stable condition  Indications: This is a 33 year old female who is 1 month postpartum and has had right upper quadrant pain intermittently.  She was admitted to the hospital and found to have elevated liver function tests as well as an ultrasound that showed what appeared to be a common bile duct stone.  She also has symptomatic cholelithiasis.  I discussed with her proceeding with a laparoscopic cholecystectomy immediately after undergoing ERCP.  Procedure: After informed consent was obtained the patient first underwent an ERCP by gastroenterology.  This went well without any difficulty.  She was then rolled into the supine position.  Antibiotics had been given.  SCDs were in place.  She was then prepped and draped in the standard sterile surgical fashion after already undergoing general anesthesia for the ERCP.  Surgical timeout was then performed.  Infiltrated Marcaine below the umbilicus.  I made a vertical incision.  I grasped the fascia and incised this sharply.  The peritoneum was entered bluntly.  I then placed a 0 Vicryl pursestring suture through the fascia.  I inserted a Hassan trocar and insufflated the abdomen to 15 mmHg pressure.  I then placed 3 further 5 mm trochars in the epigastrium and right upper quadrant under direct vision without complication.  She was noted to have chronic cholecystitis.  Her gallbladder was retracted cephalad and lateral.  I was able to dissect the triangle and get the critical view of safety.  I then clipped the cystic duct leaving 2 clips in place.   These clips completely traversed the duct and the duct was viable.  I treated the cystic artery in a similar fashion.  There was a smaller branch of the cystic artery that I also clipped and divided.  Cautery was then used to remove the gallbladder from the liver bed.  It was then placed in a retrieval bag and removed the umbilicus.  Hemostasis was observed.  There was no spillage of bile.  I then placed and I then remove the [facility] trocar and tied down my pursestring.  Due to her body habitus I did place 2 additional 0 Vicryl sutures using the suture passer device at the umbilicus.  The remaining trochars were removed and the abdomen was desufflated.  These were all closed with 4-0 Monocryl, glue, and Steri-Strips.  She tolerated this well was extubated and transferred to recovery stable.

## 2018-11-15 ENCOUNTER — Other Ambulatory Visit: Payer: Self-pay | Admitting: Nurse Practitioner

## 2018-11-15 ENCOUNTER — Encounter (HOSPITAL_COMMUNITY): Payer: Self-pay | Admitting: General Surgery

## 2018-11-15 DIAGNOSIS — R7989 Other specified abnormal findings of blood chemistry: Secondary | ICD-10-CM

## 2018-11-15 DIAGNOSIS — R945 Abnormal results of liver function studies: Secondary | ICD-10-CM

## 2018-11-15 DIAGNOSIS — F419 Anxiety disorder, unspecified: Secondary | ICD-10-CM

## 2018-11-15 LAB — CBC
HCT: 35.5 % — ABNORMAL LOW (ref 36.0–46.0)
Hemoglobin: 11.5 g/dL — ABNORMAL LOW (ref 12.0–15.0)
MCH: 29.3 pg (ref 26.0–34.0)
MCHC: 32.4 g/dL (ref 30.0–36.0)
MCV: 90.6 fL (ref 80.0–100.0)
Platelets: 299 10*3/uL (ref 150–400)
RBC: 3.92 MIL/uL (ref 3.87–5.11)
RDW: 12.6 % (ref 11.5–15.5)
WBC: 7 10*3/uL (ref 4.0–10.5)
nRBC: 0 % (ref 0.0–0.2)

## 2018-11-15 LAB — HEPATIC FUNCTION PANEL
ALT: 491 U/L — ABNORMAL HIGH (ref 0–44)
AST: 93 U/L — ABNORMAL HIGH (ref 15–41)
Albumin: 3.6 g/dL (ref 3.5–5.0)
Alkaline Phosphatase: 166 U/L — ABNORMAL HIGH (ref 38–126)
Bilirubin, Direct: 0.2 mg/dL (ref 0.0–0.2)
Indirect Bilirubin: 0.5 mg/dL (ref 0.3–0.9)
Total Bilirubin: 0.7 mg/dL (ref 0.3–1.2)
Total Protein: 5.9 g/dL — ABNORMAL LOW (ref 6.5–8.1)

## 2018-11-15 LAB — CMV IGM: CMV IgM: <30 AU/mL (ref 0.0–29.9)

## 2018-11-15 LAB — ANTINUCLEAR ANTIBODIES, IFA: ANA Ab, IFA: NEGATIVE

## 2018-11-15 LAB — LIPASE, BLOOD: Lipase: 19 U/L (ref 11–51)

## 2018-11-15 MED ORDER — BUTALBITAL-APAP-CAFFEINE 50-325-40 MG PO TABS: 1.0000 | ORAL_TABLET | Freq: Three times a day (TID) | ORAL | 0 refills | Status: DC | PRN

## 2018-11-15 MED ORDER — KETOROLAC TROMETHAMINE 15 MG/ML IJ SOLN: 15.0000 mg | Freq: Three times a day (TID) | INTRAMUSCULAR | Status: DC | PRN

## 2018-11-15 MED ORDER — ENOXAPARIN SODIUM 40 MG/0.4ML ~~LOC~~ SOLN
40.0000 mg | Freq: Every day | SUBCUTANEOUS | Status: DC
  Filled 2018-11-15: qty 0.4

## 2018-11-15 MED ORDER — OXYCODONE HCL 5 MG PO TABS: 5.0000 mg | ORAL_TABLET | Freq: Four times a day (QID) | ORAL | 0 refills | Status: DC | PRN

## 2018-11-15 NOTE — Progress Notes (Signed)
Nsg Discharge Note  Admit Date:  11/12/2018 Discharge date: 11/15/2018   Janvi Ammar Falkner to be D/C'd Home per MD order.  AVS completed.  Copy for chart, and copy for patient signed, and dated. Patient/caregiver able to verbalize understanding.  Discharge Medication: Allergies as of 11/15/2018      Reactions   Sulfonamide Derivatives Hives      Medication List    STOP taking these medications   acetaminophen 325 MG tablet Commonly known as: TYLENOL     TAKE these medications   busPIRone 5 MG tablet Commonly known as: BUSPAR Take 5 mg by mouth 2 (two) times daily.   butalbital-acetaminophen-caffeine 50-325-40 MG tablet Commonly known as: FIORICET Take 1 tablet by mouth 3 (three) times daily as needed for headache or migraine.   linaclotide 145 MCG Caps capsule Commonly known as: Linzess Take 1 capsule (145 mcg total) by mouth daily before breakfast.   omeprazole 20 MG capsule Commonly known as: PRILOSEC Take 1 capsule (20 mg total) by mouth daily.   ondansetron 4 MG disintegrating tablet Commonly known as: Zofran ODT Take 1 tablet (4 mg total) by mouth every 6 (six) hours as needed for nausea or vomiting.   oxyCODONE 5 MG immediate release tablet Commonly known as: Oxy IR/ROXICODONE Take 1 tablet (5 mg total) by mouth every 6 (six) hours as needed for moderate pain.   sertraline 50 MG tablet Commonly known as: ZOLOFT Take 50 mg by mouth daily.       Discharge Assessment: Vitals:   11/14/18 2113 11/15/18 0451  BP: 110/71 96/79  Pulse: (!) 53 70  Resp: 17 17  Temp: 98.8 F (37.1 C) 99.1 F (37.3 C)  SpO2: 96% 100%   Skin clean, dry and intact without evidence of skin break down, no evidence of skin tears noted. IV catheter discontinued intact. Site without signs and symptoms of complications - no redness or edema noted at insertion site, patient denies c/o pain - only slight tenderness at site.  Dressing with slight pressure applied.  D/c  Instructions-Education: Discharge instructions given to patient/family with verbalized understanding. D/c education completed with patient/family including follow up instructions, medication list, d/c activities limitations if indicated, with other d/c instructions as indicated by MD - patient able to verbalize understanding, all questions fully answered. Patient instructed to return to ED, call 911, or call MD for any changes in condition.  Patient escorted via [place], and D/C home via private auto.  Tresa Endo, RN 11/15/2018 12:10 PM

## 2018-11-15 NOTE — Plan of Care (Signed)

## 2018-11-15 NOTE — Op Note (Signed)
[facility] Patient Name: Brandy Castro Procedure Date : 11/14/2018 MRN: [id number] Attending MD: Gatha Mayer , MD Date of Birth: Jan 10, 1986 CSN: [id number] Age: 33 Admit Type: Inpatient Procedure:                ERCP Indications:              Bile duct stone(s) Providers:                Gatha Mayer, MD, Jeanella Cara, RN,                            Ladona Ridgel, Technician Referring MD:              Medicines:                General Anesthesia, Indomethacin 100 mg PR, Cipro                            703 mg IV Complications:            No immediate complications. Estimated Blood Loss:     Estimated blood loss: none. Procedure:                Pre-Anesthesia Assessment:                           - Prior to the procedure, a History and Physical                            was performed, and patient medications and                            allergies were reviewed. The patient's tolerance of                            previous anesthesia was also reviewed. The risks                            and benefits of the procedure and the sedation                            options and risks were discussed with the patient.                            All questions were answered, and informed consent                            was obtained. Prior Anticoagulants: The patient has                            taken no previous anticoagulant or antiplatelet                            agents. ASA Grade Assessment: II - A patient with  mild systemic disease. After reviewing the risks                            and benefits, the patient was deemed in                            satisfactory condition to undergo the procedure.                           After obtaining informed consent, the scope was                            passed under direct vision. Throughout the                            procedure, the patient's blood pressure, pulse, and                            oxygen saturations were monitored continuously. The                            [id number]-[id number] ([id number] Olympus duodenoscope was                            introduced through the mouth, and used to inject                            contrast into and used to inject contrast into the                            bile duct. The ERCP was accomplished without                            difficulty. The patient tolerated the procedure                            well. Scope In: Scope Out: Findings:      The scout film was normal. The esophagus was successfully intubated       under direct vision. The scope was advanced to a normal major papilla in       the descending duodenum without detailed examination of the pharynx,       larynx and associated structures, and upper GI tract. The upper GI tract       was grossly normal. esophagus not seen well, stomach and duodenum looked       normal the papilla was normal somewhat protuberant. Question mildly       traumatized question prior stone passage. It was deeply cannulated,       wire-guided technique. Contrast injection showed a normal biliary tree       the gallbladder never did fill. Because of the finding of the stone on       ultrasound I did a sphincterotomy, moderate size, balloon sweep produced       some sludge but no discrete stone. Extrahepatic bile duct mildly dilated       8 to 9  mm with normal intrahepatics. Occlusion cholangiogram negative.       Pancreas not entered by intent. Impression:                Recommendation:           - Cholecystectomy immediately next per Dr. Dwain SarnaWakefield                           - After dc from OR clears today and advance diet                            tomorrow. Procedure Code(s):        --- Professional ---                           [phone], Endoscopic retrograde                            cholangiopancreatography (ERCP); diagnostic,                            including collection of  specimen(s) by brushing or                            washing, when performed (separate procedure) Diagnosis Code(s):        --- Professional ---                           K80.50, Calculus of bile duct without cholangitis                            or cholecystitis without obstruction CPT copyright 2019 [employer]. All rights reserved. The codes documented in this report are preliminary and upon coder review may  be revised to meet current compliance requirements. Iva Booparl E Euna Armon, MD 11/15/2018 9:15:16 AM This report has been signed electronically. Number of Addenda: 0

## 2018-11-15 NOTE — Discharge Summary (Signed)
Physician Discharge Summary  Brandy Castro PIR:[id number] DOB: January 01, 1986 DOA: 11/12/2018  PCP: Brandy HeadingsPanosh, Wanda K, MD  Admit date: 11/12/2018 Discharge date: 11/15/2018  Time spent: 40 minutes  Recommendations for Outpatient Follow-up:  Follow-up with general surgery in 1 week  Discharge Diagnoses:  Principal Problem:   Choledocholithiasis with obstruction Active Problems:   Anxiety   Depression   Elevated LFTs   GERD (gastroesophageal reflux disease)   Abdominal pain   Discharge Condition: Stable  Diet recommendation: Regular  There were no vitals filed for this visit.  History of present illness and hospital course 33 year old female with history of migraine headaches anxiety, depression, gestational diabetes presented to the emergency department with right upper quadrant abdominal pain 3-4 episodes over the last 1 month, has been worse over the past few days.  Patient had episodes of vomiting in the last 2 days.  Work-up in the emergency department patient was found to have AST 1478, ALT 1184, alkaline phosphatase 212, total bilirubin 2.5.  COVID-19 test pending.  Ultrasound of the right upper quadrant showed cholelithiasis, choledocholithiasis with a small stone in the midportion of the common bile duct measuring 1.1 cm.  Gastroenterology is consulted, plan to do ERCP on 11/14/2018.  General surgery is consulted.  ##Choledocholithiasis -Ultrasound of right upper quadrant confirms choledocholithiasis -GI is consulted, plan to do ERCP on 11/14/2018 -NPO -Patient has improvement of LFTs, patient might have passed the stone  ##Acute cholecystitis -General surgery is consulted -Plan to do laparoscopic cholecystectomy after the ERCP is done  ##Elevated liver enzymes -Secondary to cholestatic choledocholithiasis -Continuous improvement since the admission -Hepatitis panel negative  ##Migraine headache -Keep the patient on Fioricet and follow-up -Improved  ##Anxiety  depression -Continue to Zoloft, BuSpar    Procedures: ERCP 11/14/2018  laparoscopic cholecystectomy 11/14/2018  Consultations: Gastroenterology General surgery  Discharge Exam: Vitals:   11/14/18 2113 11/15/18 0451  BP: 110/71 96/79  Pulse: (!) 53 70  Resp: 17 17  Temp: 98.8 F (37.1 C) 99.1 F (37.3 C)  SpO2: 96% 100%    General exam: Well-built, well-nourished female not in distress Respiratory system: Clear to auscultation. Respiratory effort normal. Cardiovascular system: S1 & S2 heard, RRR. No JVD, murmurs, rubs, gallops or clicks. No pedal edema. Gastrointestinal system: Abdomen is nondistended mild tenderness around the surgical sites, no guarding or rebound tenderness no organomegaly or masses felt. Normal bowel sounds heard. Central nervous system: Alert and oriented. No focal neurological deficits. Extremities: Symmetric 5 x 5 power. Skin: No rashes, lesions or ulcers Psychiatry: Judgement and insight appear normal. Mood & affect appropriate.  Discharge Instructions   Discharge Instructions    Diet - low sodium heart healthy   Complete by: As directed    Discharge instructions   Complete by: As directed    Do not lift anything more than 10lbs for 6 weeks.  F/u with general surgery in 1 week   Increase activity slowly   Complete by: As directed      Allergies as of 11/15/2018      Reactions   Sulfonamide Derivatives Hives      Medication List    STOP taking these medications   acetaminophen 325 MG tablet Commonly known as: TYLENOL     TAKE these medications   busPIRone 5 MG tablet Commonly known as: BUSPAR Take 5 mg by mouth 2 (two) times daily.   butalbital-acetaminophen-caffeine 50-325-40 MG tablet Commonly known as: FIORICET Take 1 tablet by mouth 3 (three) times daily as needed for headache  or migraine.   linaclotide 145 MCG Caps capsule Commonly known as: Linzess Take 1 capsule (145 mcg total) by mouth daily before breakfast.    omeprazole 20 MG capsule Commonly known as: PRILOSEC Take 1 capsule (20 mg total) by mouth daily.   ondansetron 4 MG disintegrating tablet Commonly known as: Zofran ODT Take 1 tablet (4 mg total) by mouth every 6 (six) hours as needed for nausea or vomiting.   oxyCODONE 5 MG immediate release tablet Commonly known as: Oxy IR/ROXICODONE Take 1 tablet (5 mg total) by mouth every 6 (six) hours as needed for moderate pain.   sertraline 50 MG tablet Commonly known as: ZOLOFT Take 50 mg by mouth daily.      Allergies  Allergen Reactions  . Sulfonamide Derivatives Hives   Follow-up [facility], [place]. Go on 11/29/2018.   Specialty: General Surgery Why: Your appointment is 08/27 at 1:45 pm Please arrive 30 minutes prior to your appointment to check in and fill out paperwork. Bring photo ID and insurance information. Contact information: [address] [place] [phone]           The results of significant diagnostics from this hospitalization (including imaging, microbiology, ancillary and laboratory) are listed below for reference.    Significant Diagnostic Studies: Dg Ercp  Result Date: 11/14/2018 CLINICAL DATA:  32 year old female with choledocholithiasis. EXAM: ERCP TECHNIQUE: Multiple spot images obtained with the fluoroscopic device and submitted for interpretation post-procedure. FLUOROSCOPY TIME:  Fluoroscopy Time:  1 minutes 24 seconds COMPARISON:  CT scan 10/29/2009; abdominal ultrasound 11/12/2018 FINDINGS: A total of 7 intraoperative spot images are submitted for review. The images demonstrate a flexible endoscope in the descending duodenum with wire cannulation of the common bile duct and cholangiogram. Filling defects in the distal common bile duct consistent with choledocholithiasis. Subsequent images demonstrate sphincterotomy and balloon sweep of the common duct. IMPRESSION: 1.  Choledocholithiasis. 2. ERCP with balloon sweep of the common duct. These images were submitted for radiologic interpretation only. Please see the procedural report for the amount of contrast and the fluoroscopy time utilized. Electronically Signed   By: Brandy Castro M.D.   On: 11/14/2018 15:32   US Abdomen Limited Ruq  Result Date: 11/12/2018 CLINICAL DATA:  Abdominal pain x1 month with increasing pain over the last 24 hours. EXAM: ULTRASOUND ABDOMEN LIMITED RIGHT UPPER QUADRANT COMPARISON:  CT dated October 29, 2009 FINDINGS: Gallbladder: Multiple gallstones are noted. There is no gallbladder wall thickening or pericholecystic free fluid. The sonographic Percell Miller sign is reported as negative. Common bile duct: Diameter: The common bile duct proximally is dilated measuring approximately 1.1 cm. There is evidence of choledocholithiasis Liver: No focal lesion identified. Within normal limits in parenchymal echogenicity. Portal vein is patent on color Doppler imaging with normal direction of blood flow towards the liver. Other: None. IMPRESSION: 1. Choledocholithiasis with a small gallstone in the midportion of the common bile duct. 2. Cholelithiasis without sonographic evidence for acute cholecystitis. Electronically Signed   By: Constance Holster M.D.   On: 11/12/2018 21:37    Microbiology: Recent Results (from the past 240 hour(s))  SARS CORONAVIRUS 2 Nasal Swab Aptima Multi Swab     Status: None   Collection Time: 11/13/18  3:04 AM   Specimen: Aptima Multi Swab; Nasal Swab  Result Value Ref Range Status   SARS Coronavirus 2 NEGATIVE NEGATIVE Final    Comment: (NOTE) SARS-CoV-2 target nucleic acids are NOT DETECTED. The  SARS-CoV-2 RNA is generally detectable in upper and lower respiratory specimens during the acute phase of infection. Negative results do not preclude SARS-CoV-2 infection, do not rule out co-infections with other pathogens, and should not be used as the sole basis for treatment  or other patient management decisions. Negative results must be combined with clinical observations, patient history, and epidemiological information. The expected result is Negative. Fact Sheet for Patients: HairSlick.nohttps://www.fda.gov/media/138098/download Fact Sheet for Healthcare Providers: quierodirigir.comhttps://www.fda.gov/media/138095/download This test is not yet approved or cleared by the [place] FDA and  has been authorized for detection and/or diagnosis of SARS-CoV-2 by FDA under an Emergency Use Authorization (EUA). This EUA will remain  in effect (meaning this test can be used) for the duration of the COVID-19 declaration under Section 56 4(b)(1) of the Act, 21 U.S.C. section 360bbb-3(b)(1), unless the authorization is terminated or revoked sooner. Performed at [facility], [address] [place], [place] [address]   Surgical PCR screen     Status: None   Collection Time: 11/14/18  2:00 AM   Specimen: Nasal Mucosa; Nasal Swab  Result Value Ref Range Status   MRSA, PCR NEGATIVE NEGATIVE Final   Staphylococcus aureus NEGATIVE NEGATIVE Final    Comment: (NOTE) The Xpert SA Assay (FDA approved for NASAL specimens in patients 35 years of age and older), is one component of a comprehensive surveillance program. It is not intended to diagnose infection nor to guide or monitor treatment. Performed at [facility], [address] [place], [place] [address]      Labs: Basic Metabolic Panel: Recent Labs  Lab 11/12/18 1235 11/12/18 2004 11/13/18 1517  NA 141 139 139  Castro 3.9 3.8 4.1  CL 104 103 109  CO2 27 24 21*  GLUCOSE 94 104* 99  BUN 13 11 7   CREATININE 0.69 0.73 0.72  CALCIUM 9.9 9.5 8.4*   Liver Function Tests: Recent Labs  Lab 11/12/18 1235 11/12/18 2004 11/13/18 1517 11/14/18 0255 11/15/18 0012  AST 994* 1,478* 570* 242* 93*  ALT 767* 1,184* 1,019* 685* 491*  ALKPHOS 171* 212* 234* 190* 166*  BILITOT 1.4* 2.5* 1.6* 0.9 0.7  PROT 7.1 7.0 6.2* 5.6*  5.9*  ALBUMIN 4.7 4.3 3.8 3.2* 3.6   Recent Labs  Lab 11/12/18 1235 11/12/18 2004 11/15/18 0012  LIPASE 14.0 27 19   No results for input(s): AMMONIA in the last 168 hours. CBC: Recent Labs  Lab 11/12/18 1235 11/12/18 2004 11/15/18 0012  WBC 7.2 4.8 7.0  NEUTROABS 4.8  --   --   HGB 13.3 13.1 11.5*  HCT 39.4 41.2 35.5*  MCV 88.1 90.9 90.6  PLT 295.0 342 299   Cardiac Enzymes: No results for input(s): CKTOTAL, CKMB, CKMBINDEX, TROPONINI in the last 168 hours. BNP: BNP (last 3 results) No results for input(s): BNP in the last 8760 hours.  ProBNP (last 3 results) No results for input(s): PROBNP in the last 8760 hours.  CBG: No results for input(s): GLUCAP in the last 168 hours.     SignedSusa Castro:  Brandy Clevinger MD.  [facility] 11/15/2018, 2:29 PM

## 2018-11-15 NOTE — Progress Notes (Signed)
[facility]/Trauma Progress Note  1 Day Post-Op   Assessment/Plan   Chronic Cholecystitis   - S/P ERCP Dr. Carlean Purl, Lap chole Dr. Donne Hazel, 08/12 - pt tolerated a CLD, advanced to reg diet - LFT's are trending down, Tbili is WNL - pt is okay for discharge from a surgical standpoint   FEN: reg diet VTE: SCD's, lovenox  ID: Unasyn once 08/12 Follow up: [facility] 2 weeks, in AVS    LOS: 2 days    Subjective: CC: abdominal soreness  Pt states she is feeling so much better. She is no longer having the pain she was before surgery. She denies N,V, fever or chills. She is urinating without issues. She has been ambulating. She is having flatus and a small BM yesterday. Surgerical discharge instructions discussed.   Objective: Vital signs in last 24 hours: Temp:  [97.2 F (36.2 C)-99.1 F (37.3 C)] 99.1 F (37.3 C) (08/13 0451) Pulse Rate:  [53-99] 70 (08/13 0451) Resp:  [9-17] 17 (08/13 0451) BP: (96-143)/(71-95) 96/79 (08/13 0451) SpO2:  [96 %-100 %] 100 % (08/13 0451) Last BM Date: 11/12/18  Intake/Output from previous day: 08/12 0701 - 08/13 0700 In: 3292.8 [I.V.:3292.8] Out: -  Intake/Output this shift: No intake/output data recorded.  PE: Gen:  Alert, NAD, pleasant, cooperative Pulm:  Rate and effort normal Abd: Soft, obese, ND, +BS, incisions with steri-strips intact are without bleeding, drainage or signs of infection. Minimal TTP without guarding. No peritonitis.  Skin: no rashes noted, warm and dry   Anti-infectives: Anti-infectives (From admission, onward)   Start     Dose/Rate Route Frequency Ordered Stop   11/14/18 0115  Ampicillin-Sulbactam (UNASYN) 3 g in sodium chloride 0.9 % 100 mL IVPB     3 g 200 mL/hr over 30 Minutes Intravenous  Once 11/14/18 0102 11/14/18 0153      Lab Results:  Recent Labs    11/12/18 2004 11/15/18 0012  WBC 4.8 7.0  HGB 13.1 11.5*  HCT 41.2 35.5*  PLT 342 299   BMET Recent Labs    11/12/18 2004  11/13/18 1517  NA 139 139  K 3.8 4.1  CL 103 109  CO2 24 21*  GLUCOSE 104* 99  BUN 11 7  CREATININE 0.73 0.72  CALCIUM 9.5 8.4*   PT/INR Recent Labs    11/13/18 0643  LABPROT 13.6  INR 1.1   CMP     Component Value Date/Time   NA 139 11/13/2018 1517   K 4.1 11/13/2018 1517   CL 109 11/13/2018 1517   CO2 21 (L) 11/13/2018 1517   GLUCOSE 99 11/13/2018 1517   BUN 7 11/13/2018 1517   CREATININE 0.72 11/13/2018 1517   CALCIUM 8.4 (L) 11/13/2018 1517   PROT 5.9 (L) 11/15/2018 0012   ALBUMIN 3.6 11/15/2018 0012   AST 93 (H) 11/15/2018 0012   ALT 491 (H) 11/15/2018 0012   ALKPHOS 166 (H) 11/15/2018 0012   BILITOT 0.7 11/15/2018 0012   GFRNONAA >60 11/13/2018 1517   GFRAA >60 11/13/2018 1517   Lipase     Component Value Date/Time   LIPASE 19 11/15/2018 0012    Studies/Results: Dg Ercp  Result Date: 11/14/2018 CLINICAL DATA:  33 year old female with choledocholithiasis. EXAM: ERCP TECHNIQUE: Multiple spot images obtained with the fluoroscopic device and submitted for interpretation post-procedure. FLUOROSCOPY TIME:  Fluoroscopy Time:  1 minutes 24 seconds COMPARISON:  CT scan 10/29/2009; abdominal ultrasound 11/12/2018 FINDINGS: A total of 7 intraoperative spot images are submitted for review. The images  demonstrate a flexible endoscope in the descending duodenum with wire cannulation of the common bile duct and cholangiogram. Filling defects in the distal common bile duct consistent with choledocholithiasis. Subsequent images demonstrate sphincterotomy and balloon sweep of the common duct. IMPRESSION: 1. Choledocholithiasis. 2. ERCP with balloon sweep of the common duct. These images were submitted for radiologic interpretation only. Please see the procedural report for the amount of contrast and the fluoroscopy time utilized. Electronically Signed   By: Malachy MoanHeath  McCullough M.D.   On: 11/14/2018 15:32      Jerre SimonJessica L Denario Bagot , [facility] 11/15/2018, 10:15  AM  Pager: [phone] Mon-Wed, Friday 7:00am-4:30pm Thurs 7am-11:30am  Consults: [phone]

## 2018-11-15 NOTE — Progress Notes (Signed)
Progress Note    ASSESSMENT AND PLAN:   33 yo old female with cholelithiasis / abnormal liver tests and US suggesting choledocholithiasis, s/p ERCP with sphinterotomy and removal of some sludge but no stones from CBD. She underwent laparoscopic cholecystectomy yesterday.  Liver tests continue to trend down.  -patient should have repeat liver tests at our office in one week.  -Surgery advancing diet today and recommending discharge home    SUBJECTIVE   Feels a lot better, post-op discomfort no certainly no pain like when admitted. Wants to go  Home, has 35 year old baby.   OBJECTIVE:     Vital signs in last 24 hours: Temp:  [97.2 F (36.2 C)-99.1 F (37.3 C)] 99.1 F (37.3 C) (08/13 0451) Pulse Rate:  [53-99] 70 (08/13 0451) Resp:  [9-17] 17 (08/13 0451) BP: (96-143)/(71-95) 96/79 (08/13 0451) SpO2:  [96 %-100 %] 100 % (08/13 0451) Last BM Date: 11/12/18 General:   Alert, well-developed female in NAD EENT:  Normal hearing, non icteric sclera, conjunctive pink.  Heart:  Regular rate and rhythm;  No lower extremity edema   Pulm: Normal respiratory effort, lungs CTA bilaterally without wheezes or crackles. Abdomen:  Soft, nondistended, nontender.  Normal bowel sounds.   Neurologic:  Alert and  oriented x4;  grossly normal neurologically. Psych:  Pleasant, cooperative.  Normal mood and affect.   Intake/Output from previous day: 08/12 0701 - 08/13 0700 In: 3292.8 [I.V.:3292.8] Out: -  Intake/Output this shift: No intake/output data recorded.  Lab Results: Recent Labs    11/12/18 1235 11/12/18 2004 11/15/18 0012  WBC 7.2 4.8 7.0  HGB 13.3 13.1 11.5*  HCT 39.4 41.2 35.5*  PLT 295.0 342 299   BMET Recent Labs    11/12/18 1235 11/12/18 2004 11/13/18 1517  NA 141 139 139  K 3.9 3.8 4.1  CL 104 103 109  CO2 27 24 21*  GLUCOSE 94 104* 99  BUN 13 11 7   CREATININE 0.69 0.73 0.72  CALCIUM 9.9 9.5 8.4*   LFT Recent Labs    11/15/18 0012  PROT 5.9*   ALBUMIN 3.6  AST 93*  ALT 491*  ALKPHOS 166*  BILITOT 0.7  BILIDIR 0.2  IBILI 0.5   PT/INR Recent Labs    11/13/18 0643  LABPROT 13.6  INR 1.1   Hepatitis Panel Recent Labs    11/13/18 0643  HEPBSAG Negative  HCVAB <0.1  HEPAIGM Negative  HEPBIGM Negative    Dg Ercp  Result Date: 11/14/2018 CLINICAL DATA:  [age] year old female with choledocholithiasis. EXAM: ERCP TECHNIQUE: Multiple spot images obtained with the fluoroscopic device and submitted for interpretation post-procedure. FLUOROSCOPY TIME:  Fluoroscopy Time:  1 minutes 24 seconds COMPARISON:  CT scan 10/29/2009; abdominal ultrasound 11/12/2018 FINDINGS: A total of 7 intraoperative spot images are submitted for review. The images demonstrate a flexible endoscope in the descending duodenum with wire cannulation of the common bile duct and cholangiogram. Filling defects in the distal common bile duct consistent with choledocholithiasis. Subsequent images demonstrate sphincterotomy and balloon sweep of the common duct. IMPRESSION: 1. Choledocholithiasis. 2. ERCP with balloon sweep of the common duct. These images were submitted for radiologic interpretation only. Please see the procedural report for the amount of contrast and the fluoroscopy time utilized. Electronically Signed   By: Malachy MoanHeath  McCullough M.D.   On: 11/14/2018 15:32    Principal Problem:   Choledocholithiasis with obstruction Active Problems:   Anxiety   Depression   Elevated LFTs   GERD (  gastroesophageal reflux disease)   Abdominal pain     LOS: 2 days   Tye Savoy ,NP 11/15/2018, 10:40 AM

## 2018-11-16 ENCOUNTER — Other Ambulatory Visit: Payer: Self-pay | Admitting: Gastroenterology

## 2018-11-16 ENCOUNTER — Ambulatory Visit (HOSPITAL_COMMUNITY): Admission: RE | Admit: 2018-11-16 | Payer: No Typology Code available for payment source | Source: Ambulatory Visit

## 2018-11-16 DIAGNOSIS — R748 Abnormal levels of other serum enzymes: Secondary | ICD-10-CM

## 2018-11-16 LAB — IGG: IgG (Immunoglobin G), Serum: 634 mg/dL (ref 586–1602)

## 2018-11-16 LAB — CERULOPLASMIN: Ceruloplasmin: 23.7 mg/dL (ref 19.0–39.0)

## 2018-11-17 LAB — HSV(HERPES SIMPLEX VRS) I + II AB-IGM: HSVI/II Comb IgM: <0.91 Ratio (ref 0.00–0.90)

## 2018-11-21 ENCOUNTER — Other Ambulatory Visit (INDEPENDENT_AMBULATORY_CARE_PROVIDER_SITE_OTHER): Payer: No Typology Code available for payment source

## 2018-11-21 DIAGNOSIS — R748 Abnormal levels of other serum enzymes: Secondary | ICD-10-CM

## 2018-11-21 LAB — HEPATIC FUNCTION PANEL
ALT: 67 U/L — ABNORMAL HIGH (ref 0–35)
AST: 16 U/L (ref 0–37)
Albumin: 4.5 g/dL (ref 3.5–5.2)
Alkaline Phosphatase: 139 U/L — ABNORMAL HIGH (ref 39–117)
Bilirubin, Direct: 0 mg/dL (ref 0.0–0.3)
Total Bilirubin: 0.3 mg/dL (ref 0.2–1.2)
Total Protein: 7.3 g/dL (ref 6.0–8.3)

## 2018-11-27 LAB — MISC LABCORP TEST (SEND OUT): Labcorp test code: 9985

## 2018-12-27 ENCOUNTER — Ambulatory Visit (INDEPENDENT_AMBULATORY_CARE_PROVIDER_SITE_OTHER): Payer: No Typology Code available for payment source | Admitting: Family Medicine

## 2018-12-27 ENCOUNTER — Other Ambulatory Visit: Payer: Self-pay

## 2018-12-27 ENCOUNTER — Encounter: Payer: Self-pay | Admitting: Family Medicine

## 2018-12-27 VITALS — BP 110/68 | HR 80 | Temp 98.3°F | Ht 66.0 in | Wt 225.0 lb

## 2018-12-27 DIAGNOSIS — R519 Headache, unspecified: Secondary | ICD-10-CM

## 2018-12-27 DIAGNOSIS — R51 Headache: Secondary | ICD-10-CM

## 2018-12-27 DIAGNOSIS — Z23 Encounter for immunization: Secondary | ICD-10-CM | POA: Diagnosis not present

## 2018-12-27 DIAGNOSIS — Z7689 Persons encountering health services in other specified circumstances: Secondary | ICD-10-CM

## 2018-12-27 DIAGNOSIS — F419 Anxiety disorder, unspecified: Secondary | ICD-10-CM | POA: Diagnosis not present

## 2018-12-27 NOTE — Patient Instructions (Signed)
Migraine Headache A migraine headache is an intense, throbbing pain on one side or both sides of the head. Migraine headaches may also cause other symptoms, such as nausea, vomiting, and sensitivity to light and noise. A migraine headache can last from 4 hours to 3 days. Talk with your doctor about what things may bring on (trigger) your migraine headaches. What are the causes? The exact cause of this condition is not known. However, a migraine may be caused when nerves in the brain become irritated and release chemicals that cause inflammation of blood vessels. This inflammation causes pain. This condition may be triggered or caused by:  Drinking alcohol.  Smoking.  Taking medicines, such as: ? Medicine used to treat chest pain (nitroglycerin). ? Birth control pills. ? Estrogen. ? Certain blood pressure medicines.  Eating or drinking products that contain nitrates, glutamate, aspartame, or tyramine. Aged cheeses, chocolate, or caffeine may also be triggers.  Doing physical activity. Other things that may trigger a migraine headache include:  Menstruation.  Pregnancy.  Hunger.  Stress.  Lack of sleep or too much sleep.  Weather changes.  Fatigue. What increases the risk? The following factors may make you more likely to experience migraine headaches:  Being a certain age. This condition is more common in people who are 25-55 years old.  Being female.  Having a family history of migraine headaches.  Being Caucasian.  Having a mental health condition, such as depression or anxiety.  Being obese. What are the signs or symptoms? The main symptom of this condition is pulsating or throbbing pain. This pain may:  Happen in any area of the head, such as on one side or both sides.  Interfere with daily activities.  Get worse with physical activity.  Get worse with exposure to bright lights or loud noises. Other symptoms may include:  Nausea.  Vomiting.   Dizziness.  General sensitivity to bright lights, loud noises, or smells. Before you get a migraine headache, you may get warning signs (an aura). An aura may include:  Seeing flashing lights or having blind spots.  Seeing bright spots, halos, or zigzag lines.  Having tunnel vision or blurred vision.  Having numbness or a tingling feeling.  Having trouble talking.  Having muscle weakness. Some people have symptoms after a migraine headache (postdromal phase), such as:  Feeling tired.  Difficulty concentrating. How is this diagnosed? A migraine headache can be diagnosed based on:  Your symptoms.  A physical exam.  Tests, such as: ? CT scan or an MRI of the head. These imaging tests can help rule out other causes of headaches. ? Taking fluid from the spine (lumbar puncture) and analyzing it (cerebrospinal fluid analysis, or CSF analysis). How is this treated? This condition may be treated with medicines that:  Relieve pain.  Relieve nausea.  Prevent migraine headaches. Treatment for this condition may also include:  Acupuncture.  Lifestyle changes like avoiding foods that trigger migraine headaches.  Biofeedback.  Cognitive behavioral therapy. Follow these instructions at home: Medicines  Take over-the-counter and prescription medicines only as told by your health care provider.  Ask your health care provider if the medicine prescribed to you: ? Requires you to avoid driving or using heavy machinery. ? Can cause constipation. You may need to take these actions to prevent or treat constipation:  Drink enough fluid to keep your urine pale yellow.  Take over-the-counter or prescription medicines.  Eat foods that are high in fiber, such as beans, whole grains, and   fresh fruits and vegetables.  Limit foods that are high in fat and processed sugars, such as fried or sweet foods. Lifestyle  Do not drink alcohol.  Do not use any products that contain nicotine  or tobacco, such as cigarettes, e-cigarettes, and chewing tobacco. If you need help quitting, ask your health care provider.  Get at least 8 hours of sleep every night.  Find ways to manage stress, such as meditation, deep breathing, or yoga. General instructions      Keep a journal to find out what may trigger your migraine headaches. For example, write down: ? What you eat and drink. ? How much sleep you get. ? Any change to your diet or medicines.  If you have a migraine headache: ? Avoid things that make your symptoms worse, such as bright lights. ? It may help to lie down in a dark, quiet room. ? Do not drive or use heavy machinery. ? Ask your health care provider what activities are safe for you while you are experiencing symptoms.  Keep all follow-up visits as told by your health care provider. This is important. Contact a health care provider if:  You develop symptoms that are different or more severe than your usual migraine headache symptoms.  You have more than 15 headache days in one month. Get help right away if:  Your migraine headache becomes severe.  Your migraine headache lasts longer than 72 hours.  You have a fever.  You have a stiff neck.  You have vision loss.  Your muscles feel weak or like you cannot control them.  You start to lose your balance often.  You have trouble walking.  You faint.  You have a seizure. Summary  A migraine headache is an intense, throbbing pain on one side or both sides of the head. Migraines may also cause other symptoms, such as nausea, vomiting, and sensitivity to light and noise.  This condition may be treated with medicines and lifestyle changes. You may also need to avoid certain things that trigger a migraine headache.  Keep a journal to find out what may trigger your migraine headaches.  Contact your health care provider if you have more than 15 headache days in a month or you develop symptoms that are  different or more severe than your usual migraine headache symptoms. This information is not intended to replace advice given to you by your health care provider. Make sure you discuss any questions you have with your health care provider. Document Released: 03/21/2005 Document Revised: 07/13/2018 Document Reviewed: 05/03/2018 Else[employer] Patient Education  2020 [employer]  Living With Anxiety  After being diagnosed with an anxiety disorder, you may be relieved to know why you have felt or behaved a certain way. It is natural to also feel overwhelmed about the treatment ahead and what it will mean for your life. With care and support, you can manage this condition and recover from it. How to cope with anxiety Dealing with stress Stress is your body's reaction to life changes and events, both good and bad. Stress can last just a few hours or it can be ongoing. Stress can play a major role in anxiety, so it is important to learn both how to cope with stress and how to think about it differently. Talk with your health care provider or a counselor to learn more about stress reduction. He or she may suggest some stress reduction techniques, such as:  Music therapy. This can include creating or listening  to music that you enjoy and that inspires you.  Mindfulness-based meditation. This involves being aware of your normal breaths, rather than trying to control your breathing. It can be done while sitting or walking.  Centering prayer. This is a kind of meditation that involves focusing on a word, phrase, or sacred image that is meaningful to you and that brings you peace.  Deep breathing. To do this, expand your stomach and inhale slowly through your nose. Hold your breath for 3-5 seconds. Then exhale slowly, allowing your stomach muscles to relax.  Self-talk. This is a skill where you identify thought patterns that lead to anxiety reactions and correct those thoughts.  Muscle relaxation. This  involves tensing muscles then relaxing them. Choose a stress reduction technique that fits your lifestyle and personality. Stress reduction techniques take time and practice. Set aside 5-15 minutes a day to do them. Therapists can offer training in these techniques. The training may be covered by some insurance plans. Other things you can do to manage stress include:  Keeping a stress diary. This can help you learn what triggers your stress and ways to control your response.  Thinking about how you respond to certain situations. You may not be able to control everything, but you can control your reaction.  Making time for activities that help you relax, and not feeling guilty about spending your time in this way. Therapy combined with coping and stress-reduction skills provides the best chance for successful treatment. Medicines Medicines can help ease symptoms. Medicines for anxiety include:  Anti-anxiety drugs.  Antidepressants.  Beta-blockers. Medicines may be used as the main treatment for anxiety disorder, along with therapy, or if other treatments are not working. Medicines should be prescribed by a health care provider. Relationships Relationships can play a big part in helping you recover. Try to spend more time connecting with trusted friends and family members. Consider going to couples counseling, taking family education classes, or going to family therapy. Therapy can help you and others better understand the condition. How to recognize changes in your condition Everyone has a different response to treatment for anxiety. Recovery from anxiety happens when symptoms decrease and stop interfering with your daily activities at home or work. This may mean that you will start to:  Have better concentration and focus.  Sleep better.  Be less irritable.  Have more energy.  Have improved memory. It is important to recognize when your condition is getting worse. Contact your health  care provider if your symptoms interfere with home or work and you do not feel like your condition is improving. Where to find help and support: You can get help and support from these sources:  Self-help groups.  Online and [employer].  A trusted spiritual leader.  Couples counseling.  Family education classes.  Family therapy. Follow these instructions at home:  Eat a healthy diet that includes plenty of vegetables, fruits, whole grains, low-fat dairy products, and lean protein. Do not eat a lot of foods that are high in solid fats, added sugars, or salt.  Exercise. Most adults should do the following: ? Exercise for at least 150 minutes each week. The exercise should increase your heart rate and make you sweat (moderate-intensity exercise). ? Strengthening exercises at least twice a week.  Cut down on caffeine, tobacco, alcohol, and other potentially harmful substances.  Get the right amount and quality of sleep. Most adults need 7-9 hours of sleep each night.  Make choices that simplify your life.  Take over-the-counter and prescription medicines only as told by your health care provider.  Avoid caffeine, alcohol, and certain over-the-counter cold medicines. These may make you feel worse. Ask your pharmacist which medicines to avoid.  Keep all follow-up visits as told by your health care provider. This is important. Questions to ask your health care provider  Would I benefit from therapy?  How often should I follow up with a health care provider?  How long do I need to take medicine?  Are there any long-term side effects of my medicine?  Are there any alternatives to taking medicine? Contact a health care provider if:  You have a hard time staying focused or finishing daily tasks.  You spend many hours a day feeling worried about everyday life.  You become exhausted by worry.  You start to have headaches, feel tense, or have nausea.  You urinate  more than normal.  You have diarrhea. Get help right away if:  You have a racing heart and shortness of breath.  You have thoughts of hurting yourself or others. If you ever feel like you may hurt yourself or others, or have thoughts about taking your own life, get help right away. You can go to your nearest emergency department or call:  Your local emergency services (911 in the [place]  A suicide crisis helpline, such as the National Suicide Prevention Lifeline at (972)463-33271-858-784-2777. This is open 24-hours a day. Summary  Taking steps to deal with stress can help calm you.  Medicines cannot cure anxiety disorders, but they can help ease symptoms.  Family, friends, and partners can play a big part in helping you recover from an anxiety disorder. This information is not intended to replace advice given to you by your health care provider. Make sure you discuss any questions you have with your health care provider. Document Released: 03/15/2016 Document Revised: 03/03/2017 Document Reviewed: 03/15/2016 [employer] Patient Education  2020 [employer]

## 2018-12-29 ENCOUNTER — Other Ambulatory Visit: Payer: Self-pay | Admitting: Family Medicine

## 2018-12-31 ENCOUNTER — Encounter: Payer: Self-pay | Admitting: Family Medicine

## 2018-12-31 NOTE — Telephone Encounter (Signed)
Rx filled on 12/31/2018. Ok to refill
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
## 2019-01-02 ENCOUNTER — Encounter: Payer: Self-pay | Admitting: Family Medicine

## 2019-01-05 ENCOUNTER — Other Ambulatory Visit: Payer: Self-pay | Admitting: Gastroenterology
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
## 2019-01-08 ENCOUNTER — Encounter: Payer: Self-pay | Admitting: Neurology

## 2019-01-09 ENCOUNTER — Encounter: Payer: Self-pay | Admitting: Neurology

## 2019-01-09 ENCOUNTER — Other Ambulatory Visit: Payer: Self-pay

## 2019-01-09 ENCOUNTER — Telehealth (INDEPENDENT_AMBULATORY_CARE_PROVIDER_SITE_OTHER): Payer: No Typology Code available for payment source | Admitting: Neurology

## 2019-01-09 VITALS — Ht 66.0 in | Wt 220.0 lb

## 2019-01-09 DIAGNOSIS — G4440 Drug-induced headache, not elsewhere classified, not intractable: Secondary | ICD-10-CM

## 2019-01-09 DIAGNOSIS — G43719 Chronic migraine without aura, intractable, without status migrainosus: Secondary | ICD-10-CM

## 2019-01-09 DIAGNOSIS — G43119 Migraine with aura, intractable, without status migrainosus: Secondary | ICD-10-CM

## 2019-01-09 MED ORDER — PROPRANOLOL HCL ER 60 MG PO CP24: 60.0000 mg | ORAL_CAPSULE | Freq: Every day | ORAL | 3 refills | Status: DC

## 2019-01-09 MED ORDER — SUMATRIPTAN SUCCINATE 100 MG PO TABS: 100.0000 mg | ORAL_TABLET | ORAL | 2 refills | Status: DC | PRN

## 2019-01-09 NOTE — Patient Instructions (Addendum)
1. For preventative management, start propranolol ER 60mg  daily.  We can increase dose to 80mg  daily in 4 weeks if needed. 2.  For abortive therapy, sumatriptan 100mg .  Advised to hold breastfeeding for 8-12 hours after dose.  Limit use to no more than 2 days out of week to prevent risk of rebound or medication-overuse headache. 3. Stop Fioricet, ibuprofen, and all OTC medications 4.  Keep headache diary 5.  Exercise, hydration, caffeine cessation (stop drinking cola), sleep hygiene, monitor for and avoid triggers 6.  Consider:  magnesium citrate 400mg  daily, riboflavin 400mg  daily, and coenzyme Q10 100mg  three times daily 7. Follow up 3 to 4 months.

## 2019-01-11 ENCOUNTER — Other Ambulatory Visit: Payer: Self-pay | Admitting: Gastroenterology

## 2019-01-11 MED ORDER — LINACLOTIDE 290 MCG PO CAPS: 290.0000 ug | ORAL_CAPSULE | Freq: Every day | ORAL | 3 refills | Status: DC

## 2019-01-11 NOTE — Addendum Note (Signed)
Addended by: Larina Bras on: 01/11/2019 01:34 PM   Modules accepted: Orders

## 2019-01-11 NOTE — Telephone Encounter (Signed)
I have responded to patient via mychart as she has also sent correspondence through that.

## 2019-03-03 ENCOUNTER — Other Ambulatory Visit: Payer: Self-pay | Admitting: Neurology

## 2019-03-05 ENCOUNTER — Other Ambulatory Visit: Payer: Self-pay | Admitting: Gastroenterology

## 2019-04-05 ENCOUNTER — Other Ambulatory Visit: Payer: Self-pay | Admitting: Neurology

## 2019-05-06 ENCOUNTER — Telehealth: Payer: Self-pay | Admitting: Neurology

## 2019-05-06 NOTE — Telephone Encounter (Signed)
Patient returned call call to the nurse.

## 2019-05-06 NOTE — Telephone Encounter (Signed)
Pt called

## 2019-05-06 NOTE — Telephone Encounter (Signed)
Patient called regarding her Blood Pressure medication that Dr. Everlena Cooper had prescribed for her. She recently found out that she is [redacted] weeks pregnant and would like to know if she should continue to take her Blood Pressure medication? Please Call. Thank you

## 2019-05-06 NOTE — Telephone Encounter (Signed)
I would discontinue it.  It seems to be more of an issue in the 2nd and 3rd trimester, but I would stop it now.  Migraines tend to be improved during pregnancy anyway.

## 2019-05-06 NOTE — Telephone Encounter (Signed)
Left message on voicemail.

## 2019-05-06 NOTE — Telephone Encounter (Signed)
Please advise 

## 2019-05-09 ENCOUNTER — Telehealth: Payer: No Typology Code available for payment source | Admitting: Neurology

## 2019-06-27 ENCOUNTER — Ambulatory Visit: Payer: No Typology Code available for payment source | Attending: Internal Medicine

## 2019-06-27 DIAGNOSIS — Z23 Encounter for immunization: Secondary | ICD-10-CM

## 2019-06-27 NOTE — Progress Notes (Signed)
   Covid-19 Vaccination Clinic  Name:  Brandy Castro    MRN: [id number] DOB: 22-Mar-1986  06/27/2019  Brandy Castro was observed post Covid-19 immunization for 15 minutes without incident. She was provided with Vaccine Information Sheet and instruction to access the V-Safe system.   Brandy Castro was instructed to call 911 with any severe reactions post vaccine: Marland Kitchen Difficulty breathing  . Swelling of face and throat  . A fast heartbeat  . A bad rash all over body  . Dizziness and weakness   Immunizations Administered    Name Date Dose VIS Date Route   Moderna COVID-19 Vaccine 06/27/2019  8:33 AM 0.5 mL 03/05/2019 Intramuscular   Manufacturer: Moderna   Lot: [id number]   NDC: 74255-258-94

## 2019-07-31 ENCOUNTER — Ambulatory Visit: Payer: No Typology Code available for payment source | Attending: Internal Medicine

## 2019-07-31 DIAGNOSIS — Z23 Encounter for immunization: Secondary | ICD-10-CM

## 2019-07-31 NOTE — Progress Notes (Signed)
   Covid-19 Vaccination Clinic  Name:  Brandy Castro    MRN: [id number] DOB: [birth year]  07/31/2019  Brandy Castro was observed post Covid-19 immunization for 15 minutes without incident. She was provided with Vaccine Information Sheet and instruction to access the V-Safe system.   Brandy Castro was instructed to call 911 with any severe reactions post vaccine: Marland Kitchen Difficulty breathing  . Swelling of face and throat  . A fast heartbeat  . A bad rash all over body  . Dizziness and weakness   Immunizations Administered    Name Date Dose VIS Date Route   Moderna COVID-19 Vaccine 07/31/2019  8:50 AM 0.5 mL 03/2019 Intramuscular   Manufacturer: Moderna   Lot: [id number]   NDC: 71994-129-04

## 2019-08-29 IMAGING — RF ERCP
1 series · 7 of 7 positions shown · non-contrast
Comparison: CT scan 10/29/2009; abdominal ultrasound 11/12/2018

CLINICAL DATA: 32-year-old female with choledocholithiasis.

EXAM:
ERCP
TECHNIQUE: Multiple spot images obtained with the fluoroscopic device and
submitted for interpretation post-procedure.
FLUOROSCOPY TIME:  Fluoroscopy Time:  1 minutes 24 seconds

[Series 1: run · 7 of 7 slices shown]
[im 1/7]
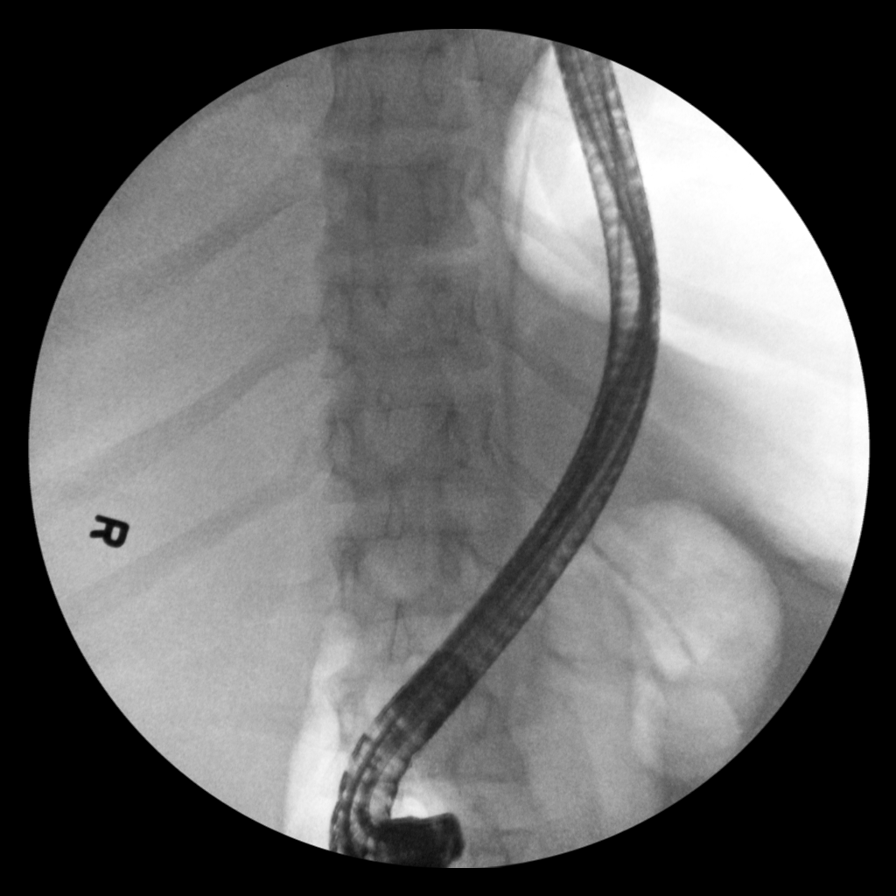
[im 2/7]
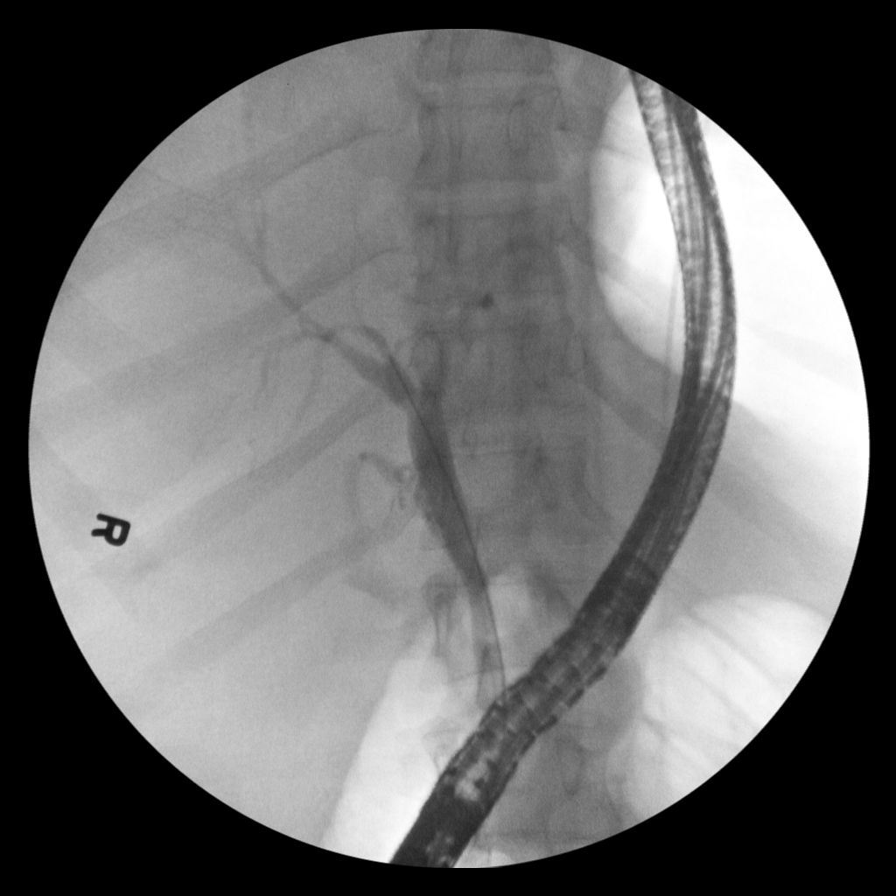
[im 3/7]
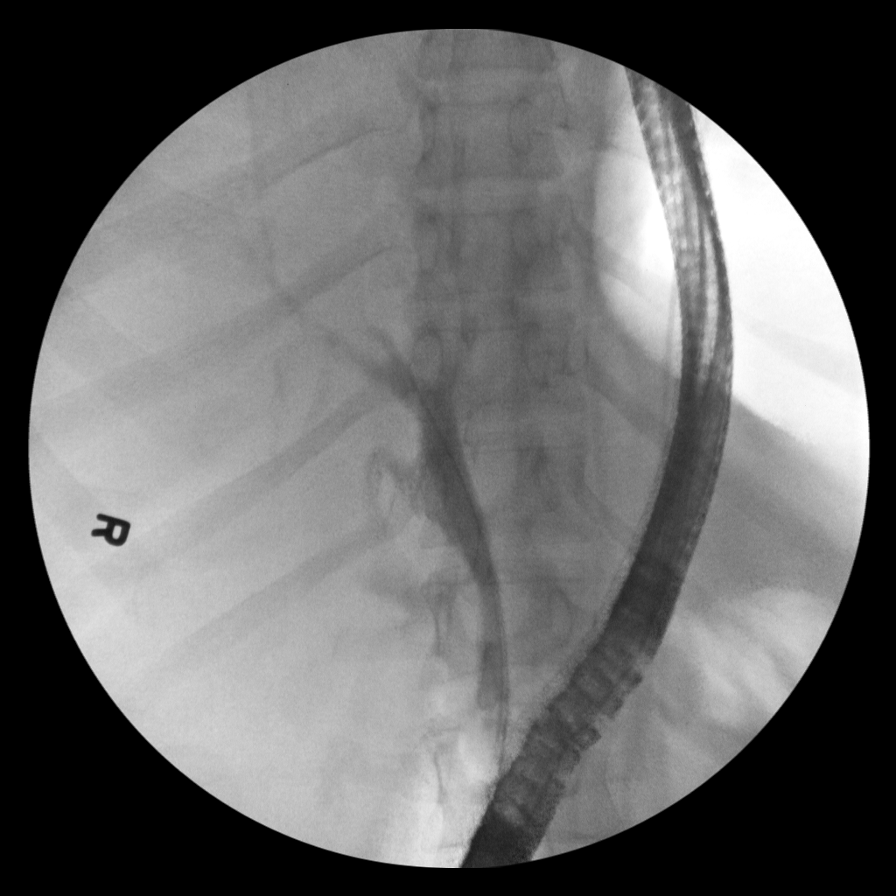
[im 4/7]
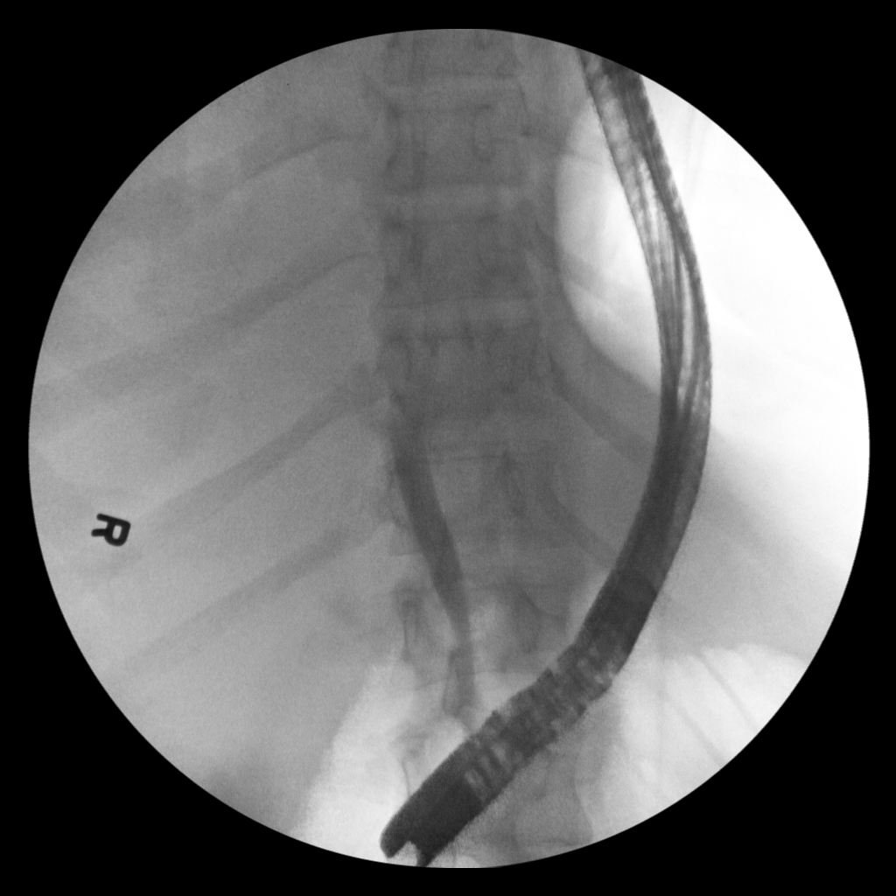
[im 5/7]
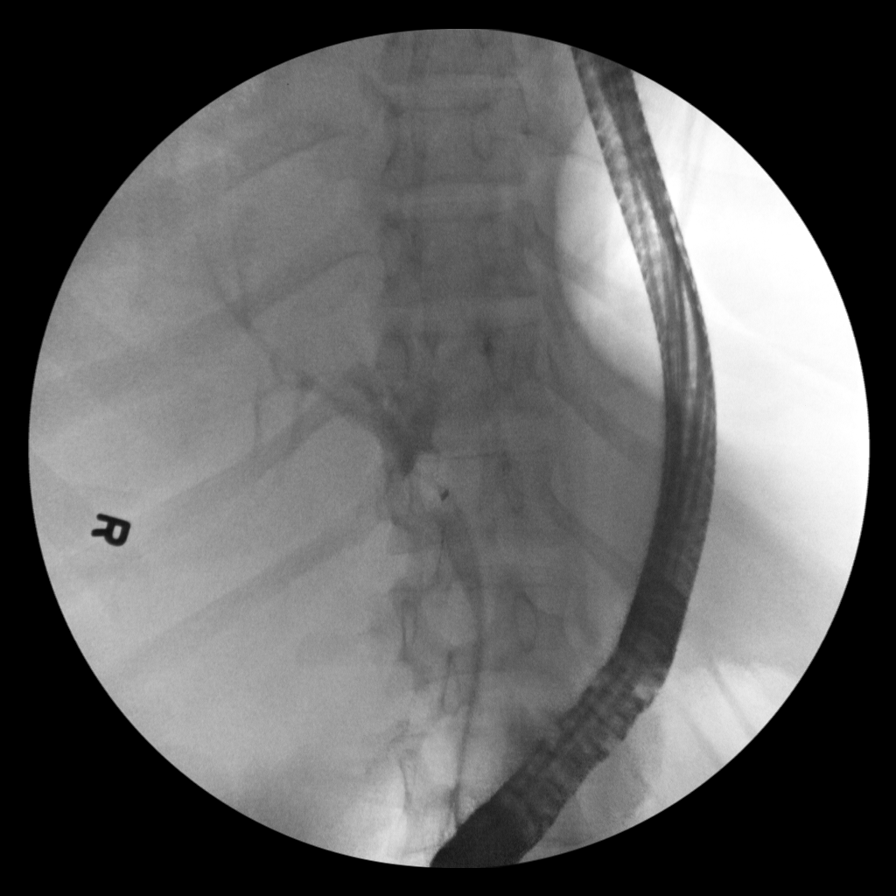
[im 6/7]
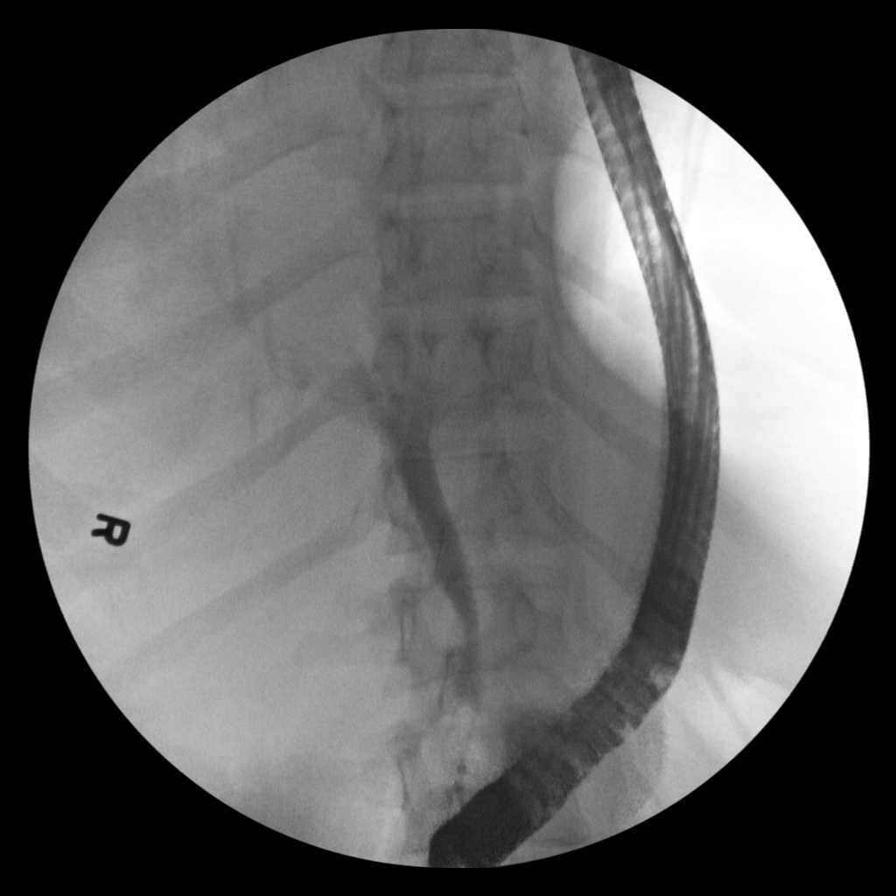
[im 7/7]
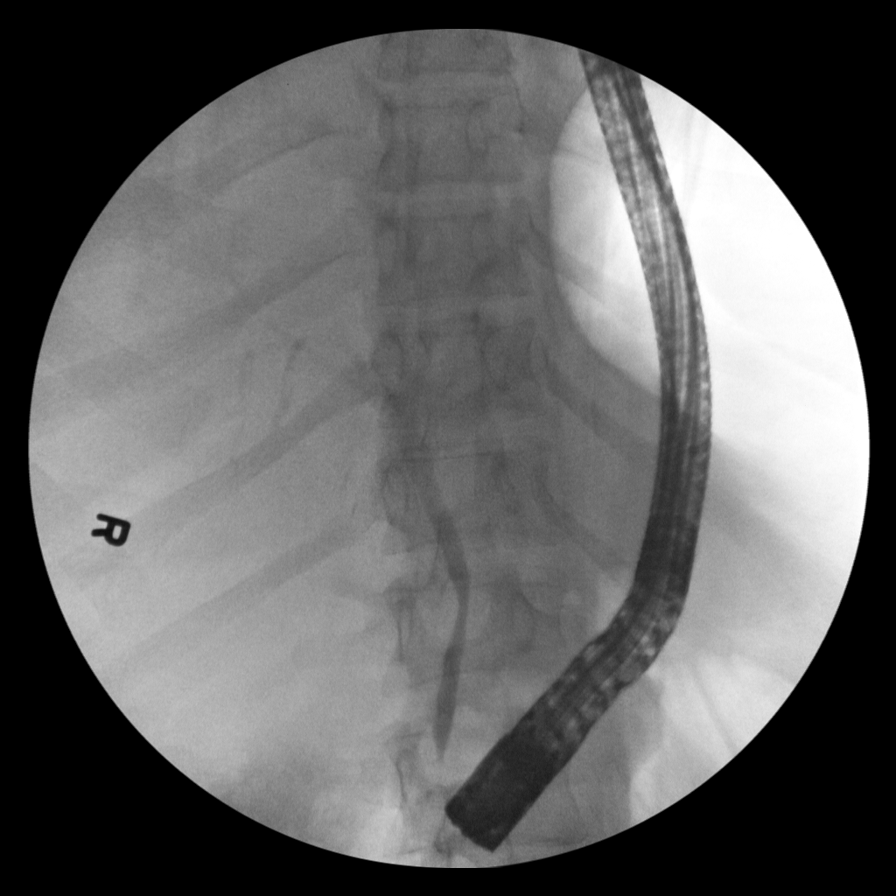

[7 of 7 positions shown; findings below may reference images not displayed]

FINDINGS: A total of 7 intraoperative spot images are submitted for review.
The images demonstrate a flexible endoscope in the descending
duodenum with wire cannulation of the common bile duct and
cholangiogram. Filling defects in the distal common bile duct
consistent with choledocholithiasis. Subsequent images demonstrate
sphincterotomy and balloon sweep of the common duct.
IMPRESSION: 1. Choledocholithiasis.
2. ERCP with balloon sweep of the common duct.

These images were submitted for radiologic interpretation only.
Please see the procedural report for the amount of contrast and the
fluoroscopy time utilized.

## 2019-12-05 ENCOUNTER — Other Ambulatory Visit: Payer: Self-pay | Admitting: Obstetrics & Gynecology

## 2019-12-11 ENCOUNTER — Encounter (HOSPITAL_COMMUNITY): Payer: Self-pay

## 2019-12-11 NOTE — Patient Instructions (Signed)
Brandy Castro  12/11/2019   Your procedure is scheduled on:  12/16/2019  Arrive at 3:15 PM at Entrance C on [address] at [facility]  and [address]. You are invited to use the FREE valet parking or use the Visitor's parking deck.  Pick up the phone at the desk and dial [phone].  Call this number if you have problems the morning of surgery: [phone]  Remember:   Do not eat food:(After Midnight) Desps de medianoche.  Do not drink clear liquids: (6 Hours before arrival) 6 horas ante llegada.  Take these medicines the morning of surgery with A SIP OF WATER:  Take 4 units of insulin at bedtime.  Do not take metformin the day of surgery.  Take labetalol as prescribed.     Do not wear jewelry, make-up or nail polish.  Do not wear lotions, powders, or perfumes. Do not wear deodorant.  Do not shave 48 hours prior to surgery.  Do not bring valuables to the hospital.  [facility] is not   responsible for any belongings or valuables brought to the hospital.  Contacts, dentures or bridgework may not be worn into surgery.  Leave suitcase in the car. After surgery it may be brought to your room.  For patients admitted to the hospital, checkout time is 11:00 AM the day of              discharge.      Please read over the following fact sheets that you were given:     Preparing for Surgery

## 2019-12-14 ENCOUNTER — Other Ambulatory Visit: Payer: Self-pay

## 2019-12-14 ENCOUNTER — Other Ambulatory Visit (HOSPITAL_COMMUNITY)
Admission: RE | Admit: 2019-12-14 | Discharge: 2019-12-14 | Disposition: A | Discharge status: Home / Self Care | Payer: PRIVATE HEALTH INSURANCE | Source: Ambulatory Visit | Attending: Obstetrics & Gynecology | Admitting: Obstetrics & Gynecology

## 2019-12-14 DIAGNOSIS — Z20822 Contact with and (suspected) exposure to covid-19: Secondary | ICD-10-CM | POA: Insufficient documentation

## 2019-12-14 LAB — CBC
HCT: 35.1 % — ABNORMAL LOW (ref 36.0–46.0)
Hemoglobin: 11.4 g/dL — ABNORMAL LOW (ref 12.0–15.0)
MCH: 29.2 pg (ref 26.0–34.0)
MCHC: 32.5 g/dL (ref 30.0–36.0)
MCV: 89.8 fL (ref 80.0–100.0)
Platelets: 242 10*3/uL (ref 150–400)
RBC: 3.91 MIL/uL (ref 3.87–5.11)
RDW: 14.8 % (ref 11.5–15.5)
WBC: 9.5 10*3/uL (ref 4.0–10.5)
nRBC: 0 % (ref 0.0–0.2)

## 2019-12-14 LAB — COMPREHENSIVE METABOLIC PANEL
ALT: 15 U/L (ref 0–44)
AST: 19 U/L (ref 15–41)
Albumin: 2.8 g/dL — ABNORMAL LOW (ref 3.5–5.0)
Alkaline Phosphatase: 113 U/L (ref 38–126)
Anion gap: 13 (ref 5–15)
BUN: <5 mg/dL — ABNORMAL LOW (ref 6–20)
CO2: 19 mmol/L — ABNORMAL LOW (ref 22–32)
Calcium: 8.9 mg/dL (ref 8.9–10.3)
Chloride: 103 mmol/L (ref 98–111)
Creatinine, Ser: 0.52 mg/dL (ref 0.44–1.00)
GFR calc Af Amer: >60 mL/min (ref >60)
GFR calc non Af Amer: >60 mL/min (ref >60)
Glucose, Bld: 117 mg/dL — ABNORMAL HIGH (ref 70–99)
Potassium: 3.7 mmol/L (ref 3.5–5.1)
Sodium: 135 mmol/L (ref 135–145)
Total Bilirubin: 0.4 mg/dL (ref 0.3–1.2)
Total Protein: 5.8 g/dL — ABNORMAL LOW (ref 6.5–8.1)

## 2019-12-14 LAB — SARS CORONAVIRUS 2 (TAT 6-24 HRS): SARS Coronavirus 2: NEGATIVE

## 2019-12-14 LAB — TYPE AND SCREEN
ABO/RH(D): O POS
Antibody Screen: NEGATIVE

## 2019-12-14 NOTE — MAU Note (Signed)
Pt here for covid swab and lab draw. Denies symptoms or sick contacts. Swab collected.  

## 2019-12-15 LAB — RPR: RPR Ser Ql: NONREACTIVE

## 2019-12-16 ENCOUNTER — Other Ambulatory Visit: Payer: Self-pay

## 2019-12-16 ENCOUNTER — Inpatient Hospital Stay (HOSPITAL_COMMUNITY)
Admission: RE | Admit: 2019-12-16 | Discharge: 2019-12-18 | DRG: 788 | Disposition: A | Discharge status: Home / Self Care | Payer: PRIVATE HEALTH INSURANCE | Attending: Obstetrics & Gynecology | Admitting: Obstetrics & Gynecology

## 2019-12-16 ENCOUNTER — Encounter (HOSPITAL_COMMUNITY): Payer: Self-pay | Admitting: Obstetrics & Gynecology

## 2019-12-16 ENCOUNTER — Encounter (HOSPITAL_COMMUNITY)
Admission: RE | Disposition: A | Discharge status: Home / Self Care | Payer: Self-pay | Source: Home / Self Care | Attending: Obstetrics & Gynecology

## 2019-12-16 ENCOUNTER — Inpatient Hospital Stay (HOSPITAL_COMMUNITY): Payer: PRIVATE HEALTH INSURANCE | Admitting: Certified Registered Nurse Anesthetist

## 2019-12-16 DIAGNOSIS — Z87891 Personal history of nicotine dependence: Secondary | ICD-10-CM | POA: Diagnosis not present

## 2019-12-16 DIAGNOSIS — F32A Depression, unspecified: Secondary | ICD-10-CM | POA: Diagnosis present

## 2019-12-16 DIAGNOSIS — O9962 Diseases of the digestive system complicating childbirth: Secondary | ICD-10-CM | POA: Diagnosis present

## 2019-12-16 DIAGNOSIS — F419 Anxiety disorder, unspecified: Secondary | ICD-10-CM | POA: Diagnosis present

## 2019-12-16 DIAGNOSIS — Z20822 Contact with and (suspected) exposure to covid-19: Secondary | ICD-10-CM | POA: Diagnosis present

## 2019-12-16 DIAGNOSIS — K589 Irritable bowel syndrome without diarrhea: Secondary | ICD-10-CM | POA: Diagnosis present

## 2019-12-16 DIAGNOSIS — F429 Obsessive-compulsive disorder, unspecified: Secondary | ICD-10-CM | POA: Diagnosis present

## 2019-12-16 DIAGNOSIS — O99344 Other mental disorders complicating childbirth: Secondary | ICD-10-CM | POA: Diagnosis present

## 2019-12-16 DIAGNOSIS — O24424 Gestational diabetes mellitus in childbirth, insulin controlled: Secondary | ICD-10-CM | POA: Diagnosis present

## 2019-12-16 DIAGNOSIS — F329 Major depressive disorder, single episode, unspecified: Secondary | ICD-10-CM | POA: Diagnosis present

## 2019-12-16 DIAGNOSIS — O99214 Obesity complicating childbirth: Secondary | ICD-10-CM | POA: Diagnosis present

## 2019-12-16 DIAGNOSIS — O139 Gestational [pregnancy-induced] hypertension without significant proteinuria, unspecified trimester: Secondary | ICD-10-CM | POA: Diagnosis present

## 2019-12-16 DIAGNOSIS — Z98891 History of uterine scar from previous surgery: Secondary | ICD-10-CM

## 2019-12-16 DIAGNOSIS — Z3A38 38 weeks gestation of pregnancy: Secondary | ICD-10-CM | POA: Diagnosis not present

## 2019-12-16 DIAGNOSIS — O134 Gestational [pregnancy-induced] hypertension without significant proteinuria, complicating childbirth: Secondary | ICD-10-CM | POA: Diagnosis present

## 2019-12-16 DIAGNOSIS — O322XX0 Maternal care for transverse and oblique lie, not applicable or unspecified: Principal | ICD-10-CM | POA: Diagnosis present

## 2019-12-16 DIAGNOSIS — O24419 Gestational diabetes mellitus in pregnancy, unspecified control: Secondary | ICD-10-CM

## 2019-12-16 DIAGNOSIS — O3663X0 Maternal care for excessive fetal growth, third trimester, not applicable or unspecified: Secondary | ICD-10-CM | POA: Diagnosis present

## 2019-12-16 LAB — GLUCOSE, CAPILLARY
Glucose-Capillary: 89 mg/dL (ref 70–99)
Glucose-Capillary: 85 mg/dL (ref 70–99)

## 2019-12-16 SURGERY — Surgical Case
Anesthesia: Spinal | Wound class: Clean Contaminated

## 2019-12-16 MED ORDER — SERTRALINE HCL 50 MG PO TABS
50.0000 mg | ORAL_TABLET | Freq: Every day | ORAL | Status: DC
  Administered 2019-12-17 – 2019-12-18 (×2): 50 mg via ORAL
  Filled 2019-12-16 (×2): qty 1

## 2019-12-16 MED ORDER — ONDANSETRON HCL 4 MG/2ML IJ SOLN: 4.0000 mg | Freq: Three times a day (TID) | INTRAMUSCULAR | Status: DC | PRN

## 2019-12-16 MED ORDER — NALBUPHINE HCL 10 MG/ML IJ SOLN
5.0000 mg | INTRAMUSCULAR | Status: DC | PRN
  Administered 2019-12-17 (×2): 5 mg via INTRAVENOUS
  Filled 2019-12-16 (×2): qty 1

## 2019-12-16 MED ORDER — SIMETHICONE 80 MG PO CHEW
80.0000 mg | CHEWABLE_TABLET | Freq: Three times a day (TID) | ORAL | Status: DC
  Administered 2019-12-17 – 2019-12-18 (×4): 80 mg via ORAL
  Filled 2019-12-16 (×4): qty 1

## 2019-12-16 MED ORDER — SODIUM CHLORIDE 0.9 % IV SOLN: INTRAVENOUS | Status: DC | PRN

## 2019-12-16 MED ORDER — MEPERIDINE HCL 25 MG/ML IJ SOLN: 6.2500 mg | INTRAMUSCULAR | Status: DC | PRN

## 2019-12-16 MED ORDER — BUSPIRONE HCL 5 MG PO TABS
10.0000 mg | ORAL_TABLET | Freq: Two times a day (BID) | ORAL | Status: DC
  Administered 2019-12-16 – 2019-12-18 (×4): 10 mg via ORAL
  Filled 2019-12-16 (×4): qty 2

## 2019-12-16 MED ORDER — SIMETHICONE 80 MG PO CHEW: 80.0000 mg | CHEWABLE_TABLET | ORAL | Status: DC | PRN

## 2019-12-16 MED ORDER — WITCH HAZEL-GLYCERIN EX PADS: 1.0000 "application " | MEDICATED_PAD | CUTANEOUS | Status: DC | PRN

## 2019-12-16 MED ORDER — KETOROLAC TROMETHAMINE 30 MG/ML IJ SOLN
INTRAMUSCULAR | Status: AC
  Filled 2019-12-16: qty 1

## 2019-12-16 MED ORDER — OXYTOCIN-SODIUM CHLORIDE 30-0.9 UT/500ML-% IV SOLN: 2.5000 [IU]/h | INTRAVENOUS | Status: AC

## 2019-12-16 MED ORDER — NALBUPHINE HCL 10 MG/ML IJ SOLN: 5.0000 mg | INTRAMUSCULAR | Status: DC | PRN

## 2019-12-16 MED ORDER — ACETAMINOPHEN 10 MG/ML IV SOLN: 1000.0000 mg | Freq: Once | INTRAVENOUS | Status: DC | PRN

## 2019-12-16 MED ORDER — LACTATED RINGERS IV SOLN: INTRAVENOUS | Status: DC

## 2019-12-16 MED ORDER — ONDANSETRON HCL 4 MG/2ML IJ SOLN
INTRAMUSCULAR | Status: DC | PRN
  Administered 2019-12-16: 4 mg via INTRAVENOUS

## 2019-12-16 MED ORDER — TETANUS-DIPHTH-ACELL PERTUSSIS 5-2.5-18.5 LF-MCG/0.5 IM SUSP: 0.5000 mL | Freq: Once | INTRAMUSCULAR | Status: DC

## 2019-12-16 MED ORDER — ONDANSETRON HCL 4 MG/2ML IJ SOLN
INTRAMUSCULAR | Status: AC
  Filled 2019-12-16: qty 2

## 2019-12-16 MED ORDER — PHENYLEPHRINE HCL-NACL 20-0.9 MG/250ML-% IV SOLN
INTRAVENOUS | Status: AC
  Filled 2019-12-16: qty 250

## 2019-12-16 MED ORDER — LABETALOL HCL 100 MG PO TABS
100.0000 mg | ORAL_TABLET | Freq: Two times a day (BID) | ORAL | Status: DC
  Administered 2019-12-16 – 2019-12-18 (×4): 100 mg via ORAL
  Filled 2019-12-16 (×4): qty 1

## 2019-12-16 MED ORDER — MENTHOL 3 MG MT LOZG: 1.0000 | LOZENGE | OROMUCOSAL | Status: DC | PRN

## 2019-12-16 MED ORDER — PRENATAL MULTIVITAMIN CH
1.0000 | ORAL_TABLET | Freq: Every day | ORAL | Status: DC
  Administered 2019-12-17 – 2019-12-18 (×2): 1 via ORAL
  Filled 2019-12-16 (×2): qty 1

## 2019-12-16 MED ORDER — SIMETHICONE 80 MG PO CHEW
80.0000 mg | CHEWABLE_TABLET | ORAL | Status: DC
  Administered 2019-12-16 – 2019-12-17 (×2): 80 mg via ORAL
  Filled 2019-12-16 (×2): qty 1

## 2019-12-16 MED ORDER — KETOROLAC TROMETHAMINE 30 MG/ML IJ SOLN
30.0000 mg | Freq: Four times a day (QID) | INTRAMUSCULAR | Status: AC | PRN
  Administered 2019-12-17 (×2): 30 mg via INTRAVENOUS
  Filled 2019-12-16 (×2): qty 1

## 2019-12-16 MED ORDER — NALOXONE HCL 0.4 MG/ML IJ SOLN: 0.4000 mg | INTRAMUSCULAR | Status: DC | PRN

## 2019-12-16 MED ORDER — FENTANYL CITRATE (PF) 100 MCG/2ML IJ SOLN: 25.0000 ug | INTRAMUSCULAR | Status: DC | PRN

## 2019-12-16 MED ORDER — FENTANYL CITRATE (PF) 100 MCG/2ML IJ SOLN
INTRAMUSCULAR | Status: DC | PRN
Start: 2019-12-16 — End: 2019-12-16
  Administered 2019-12-16: 15 ug via INTRATHECAL

## 2019-12-16 MED ORDER — KETOROLAC TROMETHAMINE 30 MG/ML IJ SOLN
30.0000 mg | Freq: Once | INTRAMUSCULAR | Status: AC | PRN
  Administered 2019-12-16: 30 mg via INTRAVENOUS

## 2019-12-16 MED ORDER — SENNOSIDES-DOCUSATE SODIUM 8.6-50 MG PO TABS
2.0000 | ORAL_TABLET | ORAL | Status: DC
  Administered 2019-12-16 – 2019-12-17 (×2): 2 via ORAL
  Filled 2019-12-16 (×2): qty 2

## 2019-12-16 MED ORDER — PHENYLEPHRINE HCL-NACL 20-0.9 MG/250ML-% IV SOLN
INTRAVENOUS | Status: DC | PRN
  Administered 2019-12-16: 60 ug/min via INTRAVENOUS

## 2019-12-16 MED ORDER — OXYCODONE HCL 5 MG PO TABS
5.0000 mg | ORAL_TABLET | ORAL | Status: DC | PRN
  Administered 2019-12-17 (×3): 10 mg via ORAL
  Administered 2019-12-17: 5 mg via ORAL
  Administered 2019-12-18 (×2): 10 mg via ORAL
  Filled 2019-12-16 (×2): qty 2
  Filled 2019-12-16: qty 1
  Filled 2019-12-16 (×3): qty 2

## 2019-12-16 MED ORDER — NALBUPHINE HCL 10 MG/ML IJ SOLN: 5.0000 mg | Freq: Once | INTRAMUSCULAR | Status: DC | PRN

## 2019-12-16 MED ORDER — COCONUT OIL OIL: 1.0000 "application " | TOPICAL_OIL | Status: DC | PRN

## 2019-12-16 MED ORDER — OXYTOCIN-SODIUM CHLORIDE 30-0.9 UT/500ML-% IV SOLN
INTRAVENOUS | Status: DC | PRN
  Administered 2019-12-16: 30 [IU] via INTRAVENOUS

## 2019-12-16 MED ORDER — DIPHENHYDRAMINE HCL 25 MG PO CAPS: 25.0000 mg | ORAL_CAPSULE | Freq: Four times a day (QID) | ORAL | Status: DC | PRN

## 2019-12-16 MED ORDER — DIPHENHYDRAMINE HCL 50 MG/ML IJ SOLN: 12.5000 mg | INTRAMUSCULAR | Status: DC | PRN

## 2019-12-16 MED ORDER — FENTANYL CITRATE (PF) 100 MCG/2ML IJ SOLN
INTRAMUSCULAR | Status: AC
  Filled 2019-12-16: qty 2

## 2019-12-16 MED ORDER — POVIDONE-IODINE 10 % EX SWAB: 2.0000 "application " | Freq: Once | CUTANEOUS | Status: DC

## 2019-12-16 MED ORDER — ENOXAPARIN SODIUM 40 MG/0.4ML ~~LOC~~ SOLN: 40.0000 mg | SUBCUTANEOUS | Status: DC

## 2019-12-16 MED ORDER — ACETAMINOPHEN 500 MG PO TABS: 1000.0000 mg | ORAL_TABLET | Freq: Four times a day (QID) | ORAL | Status: DC

## 2019-12-16 MED ORDER — NALOXONE HCL 4 MG/10ML IJ SOLN
1.0000 ug/kg/h | INTRAVENOUS | Status: DC | PRN
  Filled 2019-12-16: qty 5

## 2019-12-16 MED ORDER — DEXTROSE 5 % IV SOLN
3.0000 g | INTRAVENOUS | Status: AC
  Administered 2019-12-16: 3 g via INTRAVENOUS

## 2019-12-16 MED ORDER — KETOROLAC TROMETHAMINE 30 MG/ML IJ SOLN: 30.0000 mg | Freq: Four times a day (QID) | INTRAMUSCULAR | Status: AC | PRN

## 2019-12-16 MED ORDER — DEXTROSE 5 % IV SOLN
INTRAVENOUS | Status: AC
  Filled 2019-12-16: qty 3000

## 2019-12-16 MED ORDER — DIBUCAINE (PERIANAL) 1 % EX OINT: 1.0000 "application " | TOPICAL_OINTMENT | CUTANEOUS | Status: DC | PRN

## 2019-12-16 MED ORDER — MORPHINE SULFATE (PF) 0.5 MG/ML IJ SOLN
INTRAMUSCULAR | Status: DC | PRN
Start: 2019-12-16 — End: 2019-12-16
  Administered 2019-12-16: .15 mg via INTRATHECAL

## 2019-12-16 MED ORDER — BUPIVACAINE IN DEXTROSE 0.75-8.25 % IT SOLN
INTRATHECAL | Status: DC | PRN
  Administered 2019-12-16: 1.7 mL via INTRATHECAL

## 2019-12-16 MED ORDER — IBUPROFEN 800 MG PO TABS
800.0000 mg | ORAL_TABLET | Freq: Three times a day (TID) | ORAL | Status: DC
  Administered 2019-12-17 – 2019-12-18 (×2): 800 mg via ORAL
  Filled 2019-12-16 (×3): qty 1

## 2019-12-16 MED ORDER — SCOPOLAMINE 1 MG/3DAYS TD PT72: 1.0000 | MEDICATED_PATCH | Freq: Once | TRANSDERMAL | Status: DC

## 2019-12-16 MED ORDER — OXYTOCIN-SODIUM CHLORIDE 30-0.9 UT/500ML-% IV SOLN
INTRAVENOUS | Status: AC
  Filled 2019-12-16: qty 500

## 2019-12-16 MED ORDER — SODIUM CHLORIDE 0.9% FLUSH: 3.0000 mL | INTRAVENOUS | Status: DC | PRN

## 2019-12-16 MED ORDER — MORPHINE SULFATE (PF) 0.5 MG/ML IJ SOLN
INTRAMUSCULAR | Status: AC
  Filled 2019-12-16: qty 10

## 2019-12-16 MED ORDER — DIPHENHYDRAMINE HCL 25 MG PO CAPS: 25.0000 mg | ORAL_CAPSULE | ORAL | Status: DC | PRN

## 2019-12-16 MED ORDER — METFORMIN HCL 500 MG PO TABS
500.0000 mg | ORAL_TABLET | Freq: Every day | ORAL | Status: DC
  Administered 2019-12-17 – 2019-12-18 (×2): 500 mg via ORAL
  Filled 2019-12-16 (×2): qty 1

## 2019-12-16 MED ORDER — ACETAMINOPHEN 500 MG PO TABS
1000.0000 mg | ORAL_TABLET | Freq: Four times a day (QID) | ORAL | Status: DC
  Administered 2019-12-16 – 2019-12-18 (×7): 1000 mg via ORAL
  Filled 2019-12-16 (×7): qty 2

## 2019-12-16 SURGICAL SUPPLY — 37 items
BENZOIN TINCTURE PRP APPL 2/3 (GAUZE/BANDAGES/DRESSINGS) ×3 IMPLANT
CHLORAPREP W/TINT 26ML (MISCELLANEOUS) ×3 IMPLANT
CLAMP CORD UMBIL (MISCELLANEOUS) IMPLANT
CLOSURE WOUND 1/2 X4 (GAUZE/BANDAGES/DRESSINGS)
CLOTH BEACON ORANGE TIMEOUT ST (SAFETY) ×3 IMPLANT
DRSG OPSITE POSTOP 4X10 (GAUZE/BANDAGES/DRESSINGS) ×3 IMPLANT
ELECT REM PT RETURN 9FT ADLT (ELECTROSURGICAL) ×3
ELECTRODE REM PT RTRN 9FT ADLT (ELECTROSURGICAL) ×1 IMPLANT
EXTRACTOR VACUUM KIWI (MISCELLANEOUS) IMPLANT
EXTRACTOR VACUUM M CUP 4 TUBE (SUCTIONS) IMPLANT
EXTRACTOR VACUUM M CUP 4' TUBE (SUCTIONS)
GLOVE BIO SURGEON STRL SZ7 (GLOVE) ×3 IMPLANT
GLOVE BIOGEL PI IND STRL 7.0 (GLOVE) ×2 IMPLANT
GLOVE BIOGEL PI INDICATOR 7.0 (GLOVE) ×4
GOWN STRL REUS W/TWL LRG LVL3 (GOWN DISPOSABLE) ×6 IMPLANT
KIT ABG SYR 3ML LUER SLIP (SYRINGE) IMPLANT
NEEDLE HYPO 25X5/8 SAFETYGLIDE (NEEDLE) IMPLANT
NS IRRIG 1000ML POUR BTL (IV SOLUTION) ×3 IMPLANT
PACK C SECTION WH (CUSTOM PROCEDURE TRAY) ×3 IMPLANT
PAD OB MATERNITY 4.3X12.25 (PERSONAL CARE ITEMS) ×3 IMPLANT
PREVENA RESTOR AXIOFORM 29X28 (GAUZE/BANDAGES/DRESSINGS) ×3 IMPLANT
RTRCTR C-SECT PINK 25CM LRG (MISCELLANEOUS) IMPLANT
STRIP CLOSURE SKIN 1/2X4 (GAUZE/BANDAGES/DRESSINGS) IMPLANT
SUT MNCRL 0 VIOLET CTX 36 (SUTURE) ×2 IMPLANT
SUT MONOCRYL 0 CTX 36 (SUTURE) ×6
SUT PLAIN 0 NONE (SUTURE) IMPLANT
SUT PLAIN 2 0 (SUTURE)
SUT PLAIN ABS 2-0 CT1 27XMFL (SUTURE) IMPLANT
SUT VIC AB 0 CT1 27 (SUTURE) ×6
SUT VIC AB 0 CT1 27XBRD ANBCTR (SUTURE) ×2 IMPLANT
SUT VIC AB 2-0 CT1 27 (SUTURE) ×3
SUT VIC AB 2-0 CT1 TAPERPNT 27 (SUTURE) ×1 IMPLANT
SUT VIC AB 4-0 KS 27 (SUTURE) ×3 IMPLANT
SUT VICRYL 0 TIES 12 18 (SUTURE) IMPLANT
TOWEL OR 17X24 6PK STRL BLUE (TOWEL DISPOSABLE) ×3 IMPLANT
TRAY FOLEY W/BAG SLVR 14FR LF (SET/KITS/TRAYS/PACK) IMPLANT
WATER STERILE IRR 1000ML POUR (IV SOLUTION) ×3 IMPLANT

## 2019-12-16 NOTE — Op Note (Signed)
Primary Low Transverse Cesarean Section Procedure Note   Brandy Castro  12/16/2019  Indications: Unstable lie. Gestational HTN, A2GDM, suspected macrosomia   Pre-operative Diagnosis: Transverse Lie, Macrosomia, Gestational Hypertension, A2 Gestational Diabetes on Insulin.  Single Umbilical artery  Post-operative Diagnosis: Same   Surgeon: Shea Evans, MD - Primary   Assistants: Arlan Organ, CNM  Anesthesia: spinal   Procedure Details:  The patient was seen in the Holding Room. The risks, benefits, complications, treatment options, and expected outcomes were discussed with the patient. The patient concurred with the proposed plan, giving informed consent. identified as Brandy Castro and the procedure verified as C-Section Delivery. A Time Out was held and the above information confirmed.  After induction of anesthesia, Tracksee placed and then patient was draped and prepped in the usual sterile manner, foley was draining urine well.  A pfannenstiel incision was made and carried down through the subcutaneous tissue to the fascia. Fascial incision was made and extended transversely. The fascia was separated from the underlying rectus tissue superiorly and inferiorly. The peritoneum was identified and entered. Peritoneal incision was extended longitudinally. Alexis-O retractor placed. The utero-vesical peritoneal reflection was incised transversely and the bladder flap was bluntly freed from the lower uterine segment. A low transverse uterine incision was made. Clear amniotic fluid drained. Baby was unengaged cephalic with head towards right lower quadrant. Baby Girl was delivered cephalic, vigorous cry noted.  Apgar scores of 8 at one minute and 9 at five minutes. Delayed cord clamping done at 1 minute and baby handed to NICU team in attendance. Cord ph was not sent. Cord blood was obtained for evaluation. The placenta was removed Intact and appeared normal. The uterine  outline, tubes and ovaries appeared normal . The uterine incision was closed with running locked sutures of . A second imbricating layer sutured.   Hemostasis was observed. Alexis retractor removed. Peritoneal closure done with 2-0 Vicryl.  The fascia was then reapproximated with running sutures of 0Vicryl. The subcuticular closure was performed using 2-0plain gut. The skin was closed with 4-0Vicryl. Pravena dressing placed.   Instrument, sponge, and needle counts were correct prior the abdominal closure and were correct at the conclusion of the case.    Findings: Female infant delivered cephalic from right oblique lie. Large placenta.    Estimated Blood Loss: 790 cc  Total IV Fluids: 2300 ml LR  Urine Output: 300CC OF clear urine  Specimens: cord blood    Complications: no complications  Disposition: PACU - hemodynamically stable.   Maternal Condition: stable   Baby condition / location:  Couplet care / Skin to Skin  Attending Attestation: I performed the procedure.   Signed: Surgeon(s): Shea Evans, MD

## 2019-12-16 NOTE — Anesthesia Procedure Notes (Signed)
Spinal  Patient location during procedure: OR Start time: 12/16/2019 7:42 PM End time: 12/16/2019 7:47 PM Staffing Performed: anesthesiologist  Anesthesiologist: Mal Amabile, MD Preanesthetic Checklist Completed: patient identified, IV checked, site marked, risks and benefits discussed, surgical consent, monitors and equipment checked, pre-op evaluation and timeout performed Spinal Block Patient position: sitting Prep: DuraPrep and site prepped and draped Patient monitoring: heart rate, cardiac monitor, continuous pulse ox and blood pressure Approach: midline Location: L3-4 Injection technique: single-shot Needle Needle type: Pencan  Needle gauge: 24 G Needle length: 9 cm Needle insertion depth: 8 cm Assessment Sensory level: T4 Additional Notes Patient tolerated procedure well. Adequate sensory level.
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
## 2019-12-16 NOTE — Anesthesia Preprocedure Evaluation (Signed)
Anesthesia Evaluation  Patient identified by MRN, date of birth, ID band Patient awake    Reviewed: Allergy & Precautions, NPO status , Patient's Chart, lab work & pertinent test results, reviewed documented beta blocker date and time   Airway Mallampati: III  TM Distance: >3 FB Neck ROM: Full    Dental no notable dental hx.    Pulmonary neg pulmonary ROS, former smoker,    Pulmonary exam normal breath sounds clear to auscultation       Cardiovascular hypertension (gHTN), Pt. on home beta blockers and Pt. on medications Normal cardiovascular exam Rhythm:Regular Rate:Normal     Neuro/Psych PSYCHIATRIC DISORDERS Anxiety Depression negative neurological ROS     GI/Hepatic Neg liver ROS, GERD  Medicated,  Endo/Other  diabetes, Gestational, Insulin DependentMorbid obesity (BMI 45)  Renal/GU negative Renal ROS  negative genitourinary   Musculoskeletal negative musculoskeletal ROS (+)   Abdominal   Peds  Hematology negative hematology ROS (+)   Anesthesia Other Findings   Reproductive/Obstetrics (+) Pregnancy                             Anesthesia Physical Anesthesia Plan  ASA: III  Anesthesia Plan: Spinal   Post-op Pain Management:    Induction:   PONV Risk Score and Plan: Treatment may vary due to age or medical condition  Airway Management Planned: Natural Airway  Additional Equipment:   Intra-op Plan:   Post-operative Plan:   Informed Consent: I have reviewed the patients History and Physical, chart, labs and discussed the procedure including the risks, benefits and alternatives for the proposed anesthesia with the patient or authorized representative who has indicated his/her understanding and acceptance.     Dental advisory given  Plan Discussed with: Anesthesiologist  Anesthesia Plan Comments: (Patient identified. Risks, benefits, options discussed with patient  including but not limited to bleeding, infection, nerve damage, paralysis, failed block, incomplete pain control, headache, blood pressure changes, nausea, vomiting, reactions to medication, itching, and post partum back pain. Confirmed with bedside nurse the patient's most recent platelet count. Confirmed with the patient that they are not taking any anticoagulation, have any bleeding history or any family history of bleeding disorders. Patient expressed understanding and wishes to proceed. All questions were answered. )        Anesthesia Quick Evaluation

## 2019-12-16 NOTE — Transfer of Care (Signed)
Immediate Anesthesia Transfer of Care Note  Patient: Brandy Castro  Procedure(s) Performed: Primary CESAREAN SECTION (N/A )  Patient Location: PACU  Anesthesia Type:Spinal  Level of Consciousness: awake, alert  and oriented  Airway & Oxygen Therapy: Patient Spontanous Breathing  Post-op Assessment: Report given to RN and Post -op Vital signs reviewed and stable  Post vital signs: Reviewed and stable  Last Vitals:  Vitals Value Taken Time  BP 120/69 12/16/19 2109  Temp    Pulse 74 12/16/19 2111  Resp 26 12/16/19 2111  SpO2 98 % 12/16/19 2111  Vitals shown include unvalidated device data.  Last Pain:  Vitals:   12/16/19 1539  TempSrc: Oral         Complications: No complications documented.

## 2019-12-16 NOTE — Anesthesia Postprocedure Evaluation (Signed)
Anesthesia Post Note  Patient: Brandy Castro  Procedure(s) Performed: Primary CESAREAN SECTION (N/A )     Patient location during evaluation: PACU Anesthesia Type: Spinal Level of consciousness: oriented and awake and alert Pain management: pain level controlled Vital Signs Assessment: post-procedure vital signs reviewed and stable Respiratory status: spontaneous breathing, respiratory function stable and nonlabored ventilation Cardiovascular status: blood pressure returned to baseline and stable Postop Assessment: no headache, no backache, no apparent nausea or vomiting, spinal receding and patient able to bend at knees Anesthetic complications: no   No complications documented.  Last Vitals:  Vitals:   12/16/19 2130 12/16/19 2145  BP: 133/76 (!) 115/57  Pulse: 74 74  Resp: 20 19  Temp:  36.8 C  SpO2: 96% 97%    Last Pain:  Vitals:   12/16/19 2145  TempSrc:   PainSc: 0-No pain   Pain Goal:                Epidural/Spinal Function Cutaneous sensation: Able to Wiggle Toes (12/16/19 2145), Patient able to flex knees: Yes (12/16/19 2145), Patient able to lift hips off bed: No (12/16/19 2145), Back pain beyond tenderness at insertion site: No (12/16/19 2145), Progressively worsening motor and/or sensory loss: No (12/16/19 2145), Bowel and/or bladder incontinence post epidural: No (12/16/19 2145)  Naquan Garman A.

## 2019-12-17 ENCOUNTER — Encounter (HOSPITAL_COMMUNITY): Payer: Self-pay | Admitting: Obstetrics & Gynecology

## 2019-12-17 LAB — GLUCOSE, CAPILLARY: Glucose-Capillary: 110 mg/dL — ABNORMAL HIGH (ref 70–99)

## 2019-12-17 LAB — CBC
HCT: 33 % — ABNORMAL LOW (ref 36.0–46.0)
Hemoglobin: 10.4 g/dL — ABNORMAL LOW (ref 12.0–15.0)
MCH: 29.1 pg (ref 26.0–34.0)
MCHC: 31.5 g/dL (ref 30.0–36.0)
MCV: 92.2 fL (ref 80.0–100.0)
Platelets: 236 10*3/uL (ref 150–400)
RBC: 3.58 MIL/uL — ABNORMAL LOW (ref 3.87–5.11)
RDW: 14.8 % (ref 11.5–15.5)
WBC: 10.2 10*3/uL (ref 4.0–10.5)
nRBC: 0 % (ref 0.0–0.2)

## 2019-12-17 MED ORDER — ENOXAPARIN SODIUM 60 MG/0.6ML ~~LOC~~ SOLN
60.0000 mg | SUBCUTANEOUS | Status: DC
  Administered 2019-12-17: 60 mg via SUBCUTANEOUS
  Filled 2019-12-17 (×2): qty 0.6

## 2019-12-17 NOTE — Progress Notes (Signed)
Subjective: POD# 1 Live born female  Birth Weight: 9 lb 1.2 oz (4116 g) APGAR: 8, 9  Newborn Delivery   Birth date/time: 12/16/2019 20:16:00 Delivery type: C-Section, Low Transverse Trial of labor: No C-section categorization: Primary     Baby name: Josie Delivering provider: MODY, VAISHALI   Feeding: breast  Pain control at delivery: Spinal   Reports feeling well, having some pain when moving. Foley was removed 1 hour ago and she has not been up to void yet. Breastfeeding is going well.   Patient reports tolerating PO.   Breast symptoms: None Pain controlled with acetaminophen and ibuprofen (OTC) Denies HA/SOB/C/P/N/V/dizziness. Flatus no. She reports vaginal bleeding as normal, without clots.  She is ambulating, urinating without difficulty.     Objective:   Vitals:   12/16/19 2220 12/16/19 2320 12/17/19 0508 12/17/19 0920  BP: 132/74 132/71 119/66 118/67  Pulse: 81 75 76 80  Resp: 18 18 18 18   Temp: 98.2 F (36.8 C) 98.1 F (36.7 C) 98.4 F (36.9 C) 98.6 F (37 C)  TempSrc: Oral Oral Oral Oral  SpO2: 98% 99% 97% 98%  Weight:      Height:         Intake/Output Summary (Last 24 hours) at 12/17/2019 1155 Last data filed at 12/17/2019 12/19/2019 Gross per 24 hour  Intake 2745.98 ml  Output 1365 ml  Net 1380.98 ml      Recent Labs    12/17/19 0533  WBC 10.2  HGB 10.4*  HCT 33.0*  PLT 236    Blood type: --/--/O POS (09/11 0934)  Rubella:   Immune Vaccines: TDaP UTD         Flu    Declined   Physical Exam:  General: alert and cooperative CV: Regular rate and rhythm Resp: clear Abdomen: soft, nontender, normal bowel sounds Incision: clean, dry and intact, Provena in place Uterine Fundus: firm, below umbilicus, nontender Lochia: moderate Ext: extremities normal, atraumatic, no cyanosis or edema and Homans sign is negative, no sign of DVT  Assessment/Plan: 34 y.o.   POD# 1. 31                  Principal Problem:   Postpartum care following cesarean  delivery 9/13 Active Problems:   Gestational hypertension  Continue to labetalol 100mg  BID PO daily  BPs WNL   A2GDM  Continue Metformin 500mg  BID  Discontinued Lantus after delivery, fasting BS this AM 110   Anxiety  Continue Buspar and Zoloft  CSW complete, no identifiable interventions    Status post primary low transverse cesarean section  Doing well, stable.              Advance diet as tolerated  Encourage rest when baby rests  Breastfeeding support  Encourage to ambulate  Routine post-op care  10/13, MSN 12/17/2019, 11:55 AM

## 2019-12-17 NOTE — Lactation Note (Addendum)
This note was copied from a baby's chart. 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 Mom with hx: GHTN, depression with anxiety and GDM on metformin.  LC observed mom has tongue tie as well. Per mom, her mother [name]) has large gap.  Infant had 2 stools and one void. Per mom, she has DEBP at home. Mom plans to tandem BF her infant along with her two year old who is still breastfeeding. Mom understands to BF infant first and then her 28 year old afterwards. Infant has been latching without difficulty mom was concern if infant is BF well, if she has colostrum. LC entered room, mom had infant latched on her left breast using the cradle hold, mom concern infant doesn't have the whole areola in her mouth and mom has large breast and nipples. Mom unlatched infant and latched infant on her right breast using the football hold, infant latched with depth, taking the whole areola and BF for total of 20 minutes. Mom easily expressed 10 mls of colostrum that was spoon fed to infant after mom latched infant at breast.  Afterwards mom did STS with infant. Mom knows to BF according to cues, 8 to 12+ times within 24 hours. Mom knows to call RN or LC if she has any questions, concerns or needs assistance with latching infant at the breast. Mom can hand express after and give infant extra volume of EBM if she chooses .  Mom made aware of O/P services, breastfeeding support groups, community resources, and our phone # for post-discharge questions.   Maternal Data Formula Feeding for Exclusion: No Has patient been taught Hand Expression?: Yes Does the patient have breastfeeding experience prior to this delivery?: Yes  Feeding Feeding Type: Breast Fed  LATCH  Score Latch: Grasps breast easily, tongue down, lips flanged, rhythmical sucking.  Audible Swallowing: Spontaneous and intermittent  Type of Nipple: Everted at rest and after stimulation  Comfort (Breast/Nipple): Soft / non-tender  Hold (Positioning): Assistance needed to correctly position infant at breast and maintain latch.  LATCH Score: 9  Interventions Interventions: Breast feeding basics reviewed;Assisted with latch;Skin to skin;Breast massage;Hand express;Expressed milk;Position options;Support pillows;Adjust position;Breast compression  Lactation Tools Discussed/Used WIC Program: No   Consult Status Consult Status: Follow-up Date: 12/18/19 Follow-up type: In-patient    Brandy Castro 12/17/2019, 1:40 AM

## 2019-12-17 NOTE — Lactation Note (Signed)
This note was copied from a baby's chart. Lactation Consultation Note  Patient Name: Brandy Castro KZLDJ'T Date: 12/17/2019 Reason for consult: Early term 37-38.6wks;Maternal endocrine disorder (LGA) Type of Endocrine Disorder?: Diabetes P2, 23 hour ETI and LGA and -2% weight loss, infant with short and tight lingual frenulum and upper  lip tie.  Mom's hx: GHTN on labetol, depression with anxiety  On zoloft and buspar and GDM on metformin and lantus the follow medication are compatible with breastfeeding.   Per mom, infant not sustaining latch, keeps coming off and breast are  little sore with latch, LC did not see abrasions but due tongue tie, LC applied 20 mm NS due infant oral anatomy and discussed with mom flanging top lip out when infant is latched. LC entered room infant became a little fussy calm infant down mom did STS and hand expressed infant was given 5 mls of colostrum by spoon. Mom latched infant on her left breast using the  football hold position with 20 mm NS, due mom having C/S , large breast and LGA infant. Infant latched well after few attempts and sustained latch,  Infant breastfeed for 15 minutes . Mom felt that this was the best infant latch with depth and she is feeling more comfortable with this position.  Due to use a 20 mm NS, LC set mom up with DEBP, mom choose to pump 6 times within 24 hour period. Mom shown how to use DEBP & how to disassemble, clean, & reassemble parts. Mom was pumping when LC was in room had had pumped 10 mls with DEBP as LC walked out of the room.   Mom's choice is to tandem feed her 36 year old and infant,  LC encouraged mom to participate in  [facility] [place] [facility] Group if she has further questions or concerns when at home.  Mom's plan: 1. Mom will latch infant with  20 mm NS and will make few attempts to latch infant without it. 2. Mom will continue to breastfeeding according to hunger cues, 8 to 12+ times within 24 hours and  mom understands infant may breast feed frequently due to cluster feeding at this time. 3.  After latching infant at breast , for some feeding afterwards mom will pump 6 times within 24 hour period for 15 minutes on initial setting and will give infant back any breast milk that is expressed. 4. Parents choice is not to use bottle, LC discussed options of offering EBM by spoon or curve tip syringe, LC alert RN to help assist parents if they choose to use the curve tip syringe. 5. Mom knows to call RN or LC if she has further questions, concerns or needs assistance with latching infant at the breast.     Maternal Data    Feeding Feeding Type: Breast Fed  LATCH Score Latch: Grasps breast easily, tongue down, lips flanged, rhythmical sucking.  Audible Swallowing: Spontaneous and intermittent  Type of Nipple: Everted at rest and after stimulation  Comfort (Breast/Nipple): Soft / non-tender  Hold (Positioning): Assistance needed to correctly position infant at breast and maintain latch.  LATCH Score: 9  Interventions Interventions: Assisted with latch;Skin to skin;Adjust position;Support pillows;Hand express;Expressed milk;DEBP;Breast compression  Lactation Tools Discussed/Used Tools: Nipple Dorris Carnes;Flanges Nipple shield size: 20 Flange Size: 27   Consult Status Consult Status: Follow-up Date: 12/18/19 Follow-up type: In-patient    Danelle Earthly 12/17/2019, 7:40 PM

## 2019-12-17 NOTE — Progress Notes (Signed)
CSW received consult for hx of Anxiety and OCD  CSW met with MOB to offer support and complete assessment.    CSW congratulated MOB and FOB on the birth of infant. CSW advised MOB of the HIPPA policy as CSW observed that MOB had guest in the room. CSW was advised that it was okay for guest to remain in the room while CSW spoke with her as "its nothing that everyone in the room doesn't already know". CSW understanding and proceeded with assessing MOB. MOB reported that's he was diagnosed with OCD recently. MOB expressed that she started therapy to better assist with this. MOB  Also reported that she was diagnose with anxiety "forever ago". MOB reported that she is currently on Zoloft and Buspar with the hopes of stopping Zoloft due to the side affects that MOB reported that she has been feeling. MOB reported that she feels that her medication has been helpful in some areas but still reported having intrusive thoughts during this pregnancy regarding "stillbirth". MOB expressed that she felt "releived once I heard her cry". MOB reported that since giving birth she has felt excellent. MOB denied having any other mental health hx or diagnosis. MOB currently denies SI and HI to CSW.   CSW inquired from [facility] on who her supports were. MOB identified her spouse and other family as support. MOB also reported that her mother lives with her. MOB reported that she has all needed items to care for infant with plan for infant o sleep in basinet once arrived home. During assessment MOB was sitting up in bed nursing infant. MOB presented as pleasant and attached   to infant as evidence buy MOB trying to nurse and calm infant while CSW was in the room.   CSW provided education regarding the baby blues period vs. perinatal mood disorders, discussed treatment and gave resources for mental health follow up if concerns arise.  CSW recommends self-evaluation during the postpartum time period using the New Mom Checklist from  Postpartum Progress and encouraged MOB to contact a medical professional if symptoms are noted at any time.   CSW provided review of Sudden Infant Death Syndrome (SIDS) precautions.     CSW identifies no further need for intervention and no barriers to discharge at this time.   Virgie Dad Hayly Litsey, MSW, LCSW [facility] [place] [phone]

## 2019-12-18 LAB — BIRTH TISSUE RECOVERY COLLECTION (PLACENTA DONATION)

## 2019-12-18 MED ORDER — IBUPROFEN 800 MG PO TABS
800.0000 mg | ORAL_TABLET | Freq: Three times a day (TID) | ORAL | 0 refills | Status: DC
Start: 2019-12-18 — End: 2020-08-27

## 2019-12-18 MED ORDER — COCONUT OIL OIL: 1.0000 "application " | TOPICAL_OIL | 0 refills | Status: DC | PRN

## 2019-12-18 MED ORDER — OXYCODONE HCL 5 MG PO TABS: 5.0000 mg | ORAL_TABLET | ORAL | 0 refills | Status: DC | PRN

## 2019-12-18 MED ORDER — METFORMIN HCL 500 MG PO TABS
500.0000 mg | ORAL_TABLET | Freq: Every day | ORAL | 1 refills | Status: DC
Start: 2019-12-19 — End: 2020-08-27

## 2019-12-18 MED ORDER — ACETAMINOPHEN 500 MG PO TABS
1000.0000 mg | ORAL_TABLET | Freq: Four times a day (QID) | ORAL | 0 refills | Status: DC
Start: 2019-12-18 — End: 2023-08-30

## 2019-12-18 MED ORDER — LABETALOL HCL 100 MG PO TABS: 100.0000 mg | ORAL_TABLET | Freq: Two times a day (BID) | ORAL | 1 refills | Status: DC

## 2019-12-18 NOTE — Discharge Summary (Signed)
OB Discharge Summary  Patient Name: Brandy Castro DOB: 29-Feb-1986 MRN: [id number]  Date of admission: 12/16/2019 Delivering provider: MODY, VAISHALI   Admitting diagnosis: Gestational hypertension [O13.9] Status post primary low transverse cesarean section [Z98.891] Intrauterine pregnancy: [redacted]w[redacted]d     Secondary diagnosis: Patient Active Problem List   Diagnosis Date Noted  . Gestational hypertension 12/16/2019  . Status post primary low transverse cesarean section 12/16/2019  . Postpartum care following cesarean delivery 9/13 11/09/2017  . GDM, class A2 11/08/2017  . Anxiety 10/29/2009  . Depression 10/29/2009   Additional problems: None   Date of discharge: 12/18/2019   Discharge diagnosis: Principal Problem:   Postpartum care following cesarean delivery 9/13 Active Problems:   Anxiety   Depression   GDM, class A2   Gestational hypertension   Status post primary low transverse cesarean section                                                         Post partum procedures:None  Augmentation: N/A Pain control: Spinal  Laceration:None  Episiotomy:None  Complications: None  Hospital course:  Sceduled C/S   34 y.o. yo [id number] at [redacted]w[redacted]d was admitted to the hospital 12/16/2019 for scheduled cesarean section with the following indication:Primary C-section for persistent trasnverse lie.Delivery details are as follows:  Membrane Rupture Time/Date: 8:16 PM ,12/16/2019   Delivery Method:C-Section, Low Transverse  Details of operation can be found in separate operative note.  Patient had an uncomplicated postpartum course.  She is ambulating, tolerating a regular diet, passing flatus, and urinating well. Patient is discharged home in stable condition on  12/18/19        Newborn Data: Birth date:12/16/2019  Birth time:8:16 PM  Gender:Female  Living status:Living  Apgars:8 ,9  Weight:4116 g     Physical exam  Vitals:   12/17/19 1345 12/17/19 1629 12/17/19 2055 12/18/19 0515   BP: (!) 123/51 126/75 (!) 102/52 117/64  Pulse: 65 80 79 82  Resp: 18 20 18 18   Temp: 98.1 F (36.7 C) 98.4 F (36.9 C) 98.6 F (37 C) 98.4 F (36.9 C)  TempSrc: Oral Oral Oral Oral  SpO2: 99% 98% 98% 98%  Weight:      Height:       General: alert, cooperative and no distress Lochia: appropriate Uterine Fundus: firm Incision: Healing well with no significant drainage DVT Evaluation: No evidence of DVT seen on physical exam. Labs: Lab Results  Component Value Date   WBC 10.2 12/17/2019   HGB 10.4 (L) 12/17/2019   HCT 33.0 (L) 12/17/2019   MCV 92.2 12/17/2019   PLT 236 12/17/2019   CMP Latest Ref Rng & Units 12/14/2019  Glucose 70 - 99 mg/dL 02/13/2020)  BUN 6 - 20 mg/dL 287(G)  Creatinine <8(T - 1.00 mg/dL 1.57  Sodium 2.62 - 035 mmol/L 135  Potassium 3.5 - 5.1 mmol/L 3.7  Chloride 98 - 111 mmol/L 103  CO2 22 - 32 mmol/L 19(L)  Calcium 8.9 - 10.3 mg/dL 8.9  Total Protein 6.5 - 8.1 g/dL 597)  Total Bilirubin 0.3 - 1.2 mg/dL 0.4  Alkaline Phos 38 - 126 U/L 113  AST 15 - 41 U/L 19  ALT 0 - 44 U/L 15   Edinburgh Postnatal Depression Scale Screening Tool 12/18/2019 12/18/2019 11/09/2017  I have been able to laugh and  see the funny side of things. 0 (No Data) 0  I have looked forward with enjoyment to things. 0 - 0  I have blamed myself unnecessarily when things went wrong. 0 - 0  I have been anxious or worried for no good reason. 2 - 1  I have felt scared or panicky for no good reason. 0 - 0  Things have been getting on top of me. 0 - 0  I have been so unhappy that I have had difficulty sleeping. 0 - 0  I have felt sad or miserable. 0 - 0  I have been so unhappy that I have been crying. 0 - 0  The thought of harming myself has occurred to me. 0 - 0  Edinburgh Postnatal Depression Scale Total 2 - 1   Vaccines: TDaP UTD         Flu    Declined  Discharge instruction:  per After Visit Summary,  [facility] booklet and  "Understanding Mother & Baby Care" hospital  booklet  After Visit Meds:  Allergies as of 12/18/2019      Reactions   Sulfonamide Derivatives Hives      Medication List    STOP taking these medications   butalbital-acetaminophen-caffeine 50-325-40 MG tablet Commonly known as: FIORICET   Linzess 290 MCG Caps capsule Generic drug: linaclotide   omeprazole 20 MG capsule Commonly known as: PRILOSEC   ondansetron 4 MG disintegrating tablet Commonly known as: Zofran ODT   propranolol ER 60 MG 24 hr capsule Commonly known as: INDERAL LA   SUMAtriptan 100 MG tablet Commonly known as: IMITREX     TAKE these medications   acetaminophen 500 MG tablet Commonly known as: TYLENOL Take 2 tablets (1,000 mg total) by mouth every 6 (six) hours.   busPIRone 10 MG tablet Commonly known as: BUSPAR Take 10 mg by mouth 2 (two) times daily.   coconut oil Oil Apply 1 application topically as needed.   ferrous sulfate 325 (65 FE) MG tablet Take 325 mg by mouth daily with breakfast.   ibuprofen 800 MG tablet Commonly known as: ADVIL Take 1 tablet (800 mg total) by mouth every 8 (eight) hours.   labetalol 100 MG tablet Commonly known as: NORMODYNE Take 1 tablet (100 mg total) by mouth 2 (two) times daily.   metFORMIN 500 MG tablet Commonly known as: GLUCOPHAGE Take 1 tablet (500 mg total) by mouth daily with breakfast. Start taking on: December 19, 2019 What changed: when to take this   oxyCODONE 5 MG immediate release tablet Commonly known as: Oxy IR/ROXICODONE Take 1-2 tablets (5-10 mg total) by mouth every 4 (four) hours as needed for moderate pain.   PNV Prenatal Plus Multivitamin 27-1 MG Tabs Take 1 tablet by mouth daily.   sertraline 50 MG tablet Commonly known as: ZOLOFT Take 50 mg by mouth daily.      Diet: routine diet  Activity: Advance as tolerated. Pelvic rest for 6 weeks.   Postpartum contraception: Not Discussed  Newborn Data: Live born female  Birth Weight: 9 lb 1.2 oz (4116 g) APGAR: 8,  9  Newborn Delivery   Birth date/time: 12/16/2019 20:16:00 Delivery type: C-Section, Low Transverse Trial of labor: No C-section categorization: Primary     Named Josie Baby Feeding: Breast Disposition:home with mother  Delivery Report:  Review the Delivery Report for details.    Follow up:  Follow-up Information    Shea Evans, MD. Schedule an appointment as soon as possible for a visit  in 1 week(s).   Specialty: Obstetrics and Gynecology Why: Please make an appointment for follow-up on Monday, 12/23/19.  Contact information: [address] [place] [address] [phone]              Signed: Clancy Gourd, MSN 12/18/2019, 12:28 PM

## 2020-02-10 LAB — HM PAP SMEAR: HM Pap smear: NORMAL

## 2020-08-27 ENCOUNTER — Encounter: Payer: Self-pay | Admitting: Gastroenterology

## 2020-08-27 ENCOUNTER — Ambulatory Visit (INDEPENDENT_AMBULATORY_CARE_PROVIDER_SITE_OTHER): Payer: No Typology Code available for payment source | Admitting: Gastroenterology

## 2020-08-27 VITALS — BP 110/78 | HR 81 | Ht 66.0 in | Wt 221.0 lb

## 2020-08-27 DIAGNOSIS — K5909 Other constipation: Secondary | ICD-10-CM

## 2020-08-27 MED ORDER — LINACLOTIDE 290 MCG PO CAPS: 290.0000 ug | ORAL_CAPSULE | Freq: Every day | ORAL | 0 refills | Status: AC

## 2020-08-27 NOTE — Patient Instructions (Addendum)
If you are age 37 or older, your body mass index should be between 23-30. Your Body mass index is 35.67 kg/m. If this is out of the aforementioned range listed, please consider follow up with your Primary Care Provider.  If you are age 54 or younger, your body mass index should be between 19-25. Your Body mass index is 35.67 kg/m. If this is out of the aformentioned range listed, please consider follow up with your Primary Care Provider.   __________________________________________________________  The [facility] providers would like to encourage you to use [facility] to communicate with providers for non-urgent requests or questions.  Due to long hold times on the telephone, sending your provider a message by [facility] may be a faster and more efficient way to get a response.  Please allow 48 business hours for a response.  Please remember that this is for non-urgent requests.   Please purchase the following medications over the counter and take as directed: Miralax: Take a double dose twice daily   After 5 days if you are still not having regular Bowel Movements, add Linzess 290 mgs (we are giving you samples) once daily before breakfast.   Please call us in a few weeks and give [place] an update on how you're doing.  Thank you for entrusting me with your care and for choosing [facility], Dr. Ileene Patrick
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
## 2020-10-07 ENCOUNTER — Encounter: Payer: Self-pay | Admitting: Family Medicine

## 2020-10-19 ENCOUNTER — Encounter: Payer: PRIVATE HEALTH INSURANCE | Admitting: Internal Medicine

## 2021-07-21 ENCOUNTER — Encounter: Payer: Self-pay | Admitting: Family Medicine

## 2021-07-22 ENCOUNTER — Encounter: Payer: Self-pay | Admitting: Family Medicine

## 2021-08-06 ENCOUNTER — Encounter: Payer: Self-pay | Admitting: Family Medicine

## 2021-08-25 ENCOUNTER — Ambulatory Visit (INDEPENDENT_AMBULATORY_CARE_PROVIDER_SITE_OTHER): Payer: PRIVATE HEALTH INSURANCE | Admitting: Family Medicine

## 2021-08-25 ENCOUNTER — Encounter: Payer: Self-pay | Admitting: Family Medicine

## 2021-08-25 VITALS — BP 110/92 | HR 83 | Temp 99.3°F | Ht 66.0 in | Wt 203.2 lb

## 2021-08-25 DIAGNOSIS — G43009 Migraine without aura, not intractable, without status migrainosus: Secondary | ICD-10-CM

## 2021-08-25 DIAGNOSIS — J342 Deviated nasal septum: Secondary | ICD-10-CM | POA: Diagnosis not present

## 2021-08-25 DIAGNOSIS — F411 Generalized anxiety disorder: Secondary | ICD-10-CM | POA: Diagnosis not present

## 2021-08-25 DIAGNOSIS — R030 Elevated blood-pressure reading, without diagnosis of hypertension: Secondary | ICD-10-CM

## 2021-08-25 DIAGNOSIS — R32 Unspecified urinary incontinence: Secondary | ICD-10-CM | POA: Diagnosis not present

## 2021-08-25 DIAGNOSIS — Z0000 Encounter for general adult medical examination without abnormal findings: Secondary | ICD-10-CM

## 2021-08-25 DIAGNOSIS — R634 Abnormal weight loss: Secondary | ICD-10-CM

## 2021-08-25 DIAGNOSIS — K581 Irritable bowel syndrome with constipation: Secondary | ICD-10-CM

## 2021-08-25 DIAGNOSIS — Z13220 Encounter for screening for lipoid disorders: Secondary | ICD-10-CM

## 2021-08-25 DIAGNOSIS — J302 Other seasonal allergic rhinitis: Secondary | ICD-10-CM

## 2021-08-25 LAB — POCT URINALYSIS DIPSTICK
Bilirubin, UA: NEGATIVE
Blood, UA: NEGATIVE
Glucose, UA: NEGATIVE
Leukocytes, UA: NEGATIVE
Nitrite, UA: NEGATIVE
Protein, UA: POSITIVE — AB
Spec Grav, UA: 1.025 (ref 1.010–1.025)
Urobilinogen, UA: NEGATIVE E.U./dL — AB
pH, UA: 6 (ref 5.0–8.0)
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
## 2021-08-26 LAB — COMPREHENSIVE METABOLIC PANEL
ALT: 13 U/L (ref 0–35)
AST: 18 U/L (ref 0–37)
Albumin: 5.2 g/dL (ref 3.5–5.2)
Alkaline Phosphatase: 52 U/L (ref 39–117)
BUN: 7 mg/dL (ref 6–23)
CO2: 24 mEq/L (ref 19–32)
Calcium: 10 mg/dL (ref 8.4–10.5)
Chloride: 102 mEq/L (ref 96–112)
Creatinine, Ser: 0.69 mg/dL (ref 0.40–1.20)
GFR: 110.72 mL/min (ref >60.00)
Glucose, Bld: 67 mg/dL — ABNORMAL LOW (ref 70–99)
Potassium: 3.4 mEq/L — ABNORMAL LOW (ref 3.5–5.1)
Sodium: 139 mEq/L (ref 135–145)
Total Bilirubin: 0.6 mg/dL (ref 0.2–1.2)
Total Protein: 7.8 g/dL (ref 6.0–8.3)

## 2021-08-26 LAB — CBC WITH DIFFERENTIAL/PLATELET
Basophils Absolute: 0.1 10*3/uL (ref 0.0–0.1)
Basophils Relative: 0.7 % (ref 0.0–3.0)
Eosinophils Absolute: 0 10*3/uL (ref 0.0–0.7)
Eosinophils Relative: 0 % (ref 0.0–5.0)
HCT: 41 % (ref 36.0–46.0)
Hemoglobin: 13.6 g/dL (ref 12.0–15.0)
Lymphocytes Relative: 28.8 % (ref 12.0–46.0)
Lymphs Abs: 2.2 10*3/uL (ref 0.7–4.0)
MCHC: 33.1 g/dL (ref 30.0–36.0)
MCV: 89.5 fl (ref 78.0–100.0)
Monocytes Absolute: 0.3 10*3/uL (ref 0.1–1.0)
Monocytes Relative: 4.1 % (ref 3.0–12.0)
Neutro Abs: 5.1 10*3/uL (ref 1.4–7.7)
Neutrophils Relative %: 66.4 % (ref 43.0–77.0)
Platelets: 354 10*3/uL (ref 150.0–400.0)
RBC: 4.58 Mil/uL (ref 3.87–5.11)
RDW: 14.3 % (ref 11.5–15.5)
WBC: 7.7 10*3/uL (ref 4.0–10.5)

## 2021-08-26 LAB — LIPID PANEL
Cholesterol: 179 mg/dL (ref 0–200)
HDL: 47.9 mg/dL (ref >39.00)
LDL Cholesterol: 114 mg/dL — ABNORMAL HIGH (ref 0–99)
NonHDL: 131.51
Total CHOL/HDL Ratio: 4
Triglycerides: 86 mg/dL (ref 0.0–149.0)
VLDL: 17.2 mg/dL (ref 0.0–40.0)

## 2021-08-26 LAB — T4, FREE: Free T4: 0.96 ng/dL (ref 0.60–1.60)

## 2021-08-26 LAB — HEMOGLOBIN A1C: Hgb A1c MFr Bld: 5.4 % (ref 4.6–6.5)

## 2021-08-26 LAB — TSH: TSH: 1.35 u[IU]/mL (ref 0.35–5.50)

## 2021-08-27 ENCOUNTER — Other Ambulatory Visit: Payer: Self-pay | Admitting: Family Medicine

## 2021-08-27 DIAGNOSIS — E876 Hypokalemia: Secondary | ICD-10-CM

## 2021-08-27 MED ORDER — POTASSIUM CHLORIDE CRYS ER 20 MEQ PO TBCR: 20.0000 meq | EXTENDED_RELEASE_TABLET | Freq: Every day | ORAL | 0 refills | Status: DC

## 2021-09-24 ENCOUNTER — Ambulatory Visit: Payer: PRIVATE HEALTH INSURANCE | Admitting: Family Medicine

## 2021-09-27 ENCOUNTER — Ambulatory Visit: Payer: PRIVATE HEALTH INSURANCE | Admitting: Family Medicine

## 2021-10-04 ENCOUNTER — Ambulatory Visit (INDEPENDENT_AMBULATORY_CARE_PROVIDER_SITE_OTHER): Payer: PRIVATE HEALTH INSURANCE | Admitting: Family Medicine

## 2021-10-04 VITALS — BP 108/76 | HR 91 | Temp 99.2°F | Wt 194.0 lb

## 2021-10-04 DIAGNOSIS — F422 Mixed obsessional thoughts and acts: Secondary | ICD-10-CM | POA: Diagnosis not present

## 2021-10-04 DIAGNOSIS — Z0130 Encounter for examination of blood pressure without abnormal findings: Secondary | ICD-10-CM | POA: Diagnosis not present

## 2021-10-04 DIAGNOSIS — R634 Abnormal weight loss: Secondary | ICD-10-CM | POA: Diagnosis not present
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
## 2022-06-06 ENCOUNTER — Encounter: Payer: Self-pay | Admitting: Internal Medicine

## 2022-06-06 ENCOUNTER — Telehealth (INDEPENDENT_AMBULATORY_CARE_PROVIDER_SITE_OTHER): Payer: PRIVATE HEALTH INSURANCE | Admitting: Internal Medicine

## 2022-06-06 ENCOUNTER — Telehealth: Payer: PRIVATE HEALTH INSURANCE | Admitting: Family Medicine

## 2022-06-06 VITALS — BP 140/90 | Temp 97.5°F | Wt 164.4 lb

## 2022-06-06 DIAGNOSIS — J0110 Acute frontal sinusitis, unspecified: Secondary | ICD-10-CM | POA: Diagnosis not present

## 2022-06-06 MED ORDER — AZITHROMYCIN 250 MG PO TABS: ORAL_TABLET | ORAL | 0 refills | Status: AC
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
## 2022-08-26 ENCOUNTER — Ambulatory Visit (INDEPENDENT_AMBULATORY_CARE_PROVIDER_SITE_OTHER): Payer: PRIVATE HEALTH INSURANCE | Admitting: Family Medicine

## 2022-08-26 ENCOUNTER — Encounter: Payer: Self-pay | Admitting: Family Medicine

## 2022-08-26 ENCOUNTER — Telehealth: Payer: Self-pay | Admitting: Family Medicine

## 2022-08-26 VITALS — BP 108/78 | HR 75 | Temp 98.8°F | Ht 66.0 in | Wt 175.2 lb

## 2022-08-26 DIAGNOSIS — Z0001 Encounter for general adult medical examination with abnormal findings: Secondary | ICD-10-CM | POA: Diagnosis not present

## 2022-08-26 DIAGNOSIS — Z0000 Encounter for general adult medical examination without abnormal findings: Secondary | ICD-10-CM

## 2022-08-26 DIAGNOSIS — F411 Generalized anxiety disorder: Secondary | ICD-10-CM | POA: Diagnosis not present

## 2022-08-26 DIAGNOSIS — J342 Deviated nasal septum: Secondary | ICD-10-CM | POA: Diagnosis not present

## 2022-08-26 DIAGNOSIS — R0989 Other specified symptoms and signs involving the circulatory and respiratory systems: Secondary | ICD-10-CM | POA: Diagnosis not present

## 2022-08-26 LAB — CBC WITH DIFFERENTIAL/PLATELET
Basophils Absolute: 0 10*3/uL (ref 0.0–0.1)
Basophils Relative: 0.7 % (ref 0.0–3.0)
Eosinophils Absolute: 0 10*3/uL (ref 0.0–0.7)
Eosinophils Relative: 0.3 % (ref 0.0–5.0)
HCT: 38.8 % (ref 36.0–46.0)
Hemoglobin: 12.7 g/dL (ref 12.0–15.0)
Lymphocytes Relative: 36.3 % (ref 12.0–46.0)
Lymphs Abs: 1.8 10*3/uL (ref 0.7–4.0)
MCHC: 32.7 g/dL (ref 30.0–36.0)
MCV: 90.6 fl (ref 78.0–100.0)
Monocytes Absolute: 0.3 10*3/uL (ref 0.1–1.0)
Monocytes Relative: 5.5 % (ref 3.0–12.0)
Neutro Abs: 2.9 10*3/uL (ref 1.4–7.7)
Neutrophils Relative %: 57.2 % (ref 43.0–77.0)
Platelets: 375 10*3/uL (ref 150.0–400.0)
RBC: 4.28 Mil/uL (ref 3.87–5.11)
RDW: 13.5 % (ref 11.5–15.5)
WBC: 5 10*3/uL (ref 4.0–10.5)

## 2022-08-26 LAB — BASIC METABOLIC PANEL
BUN: 11 mg/dL (ref 6–23)
CO2: 25 mEq/L (ref 19–32)
Calcium: 9.2 mg/dL (ref 8.4–10.5)
Chloride: 105 mEq/L (ref 96–112)
Creatinine, Ser: 0.68 mg/dL (ref 0.40–1.20)
GFR: 110.34 mL/min (ref >60.00)
Glucose, Bld: 80 mg/dL (ref 70–99)
Potassium: 4.3 mEq/L (ref 3.5–5.1)
Sodium: 139 mEq/L (ref 135–145)

## 2022-08-26 LAB — LIPID PANEL
Cholesterol: 181 mg/dL (ref 0–200)
HDL: 60.4 mg/dL (ref >39.00)
LDL Cholesterol: 112 mg/dL — ABNORMAL HIGH (ref 0–99)
NonHDL: 121.02
Total CHOL/HDL Ratio: 3
Triglycerides: 45 mg/dL (ref 0.0–149.0)
VLDL: 9 mg/dL (ref 0.0–40.0)

## 2022-08-26 LAB — TSH: TSH: 0.89 u[IU]/mL (ref 0.35–5.50)

## 2022-08-26 LAB — HEMOGLOBIN A1C: Hgb A1c MFr Bld: 5.3 % (ref 4.6–6.5)

## 2022-08-26 LAB — T4, FREE: Free T4: 0.72 ng/dL (ref 0.60–1.60)
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
## 2022-08-26 NOTE — Telephone Encounter (Signed)
Information has been updated, and results have been requested.

## 2022-08-26 NOTE — Telephone Encounter (Signed)
Pt is calling to let md know she sees dr Mitzi Hansen [facility] and dr Mitzi Hansen said pt need pap every 3 yrs and pt recently see dr Mitzi Hansen for pap

## 2022-08-31 ENCOUNTER — Encounter: Payer: Self-pay | Admitting: Family Medicine

## 2023-01-29 ENCOUNTER — Ambulatory Visit
Admission: EM | Admit: 2023-01-29 | Discharge: 2023-01-29 | Disposition: A | Discharge status: Home / Self Care | Payer: PRIVATE HEALTH INSURANCE | Attending: Internal Medicine | Admitting: Internal Medicine

## 2023-01-29 ENCOUNTER — Encounter: Payer: Self-pay | Admitting: Emergency Medicine

## 2023-01-29 DIAGNOSIS — J069 Acute upper respiratory infection, unspecified: Secondary | ICD-10-CM | POA: Diagnosis not present

## 2023-01-29 DIAGNOSIS — R112 Nausea with vomiting, unspecified: Secondary | ICD-10-CM | POA: Diagnosis present

## 2023-01-29 DIAGNOSIS — Z1152 Encounter for screening for COVID-19: Secondary | ICD-10-CM | POA: Insufficient documentation

## 2023-01-29 MED ORDER — ONDANSETRON 4 MG PO TBDP: 4.0000 mg | ORAL_TABLET | Freq: Three times a day (TID) | ORAL | 0 refills | Status: DC | PRN

## 2023-01-29 MED ORDER — BENZONATATE 100 MG PO CAPS: 100.0000 mg | ORAL_CAPSULE | Freq: Three times a day (TID) | ORAL | 0 refills | Status: DC

## 2023-01-29 MED ORDER — PROMETHAZINE-DM 6.25-15 MG/5ML PO SYRP: 5.0000 mL | ORAL_SOLUTION | Freq: Every evening | ORAL | 0 refills | Status: AC | PRN

## 2023-01-29 MED ORDER — GUAIFENESIN ER 1200 MG PO TB12: 1200.0000 mg | ORAL_TABLET | Freq: Two times a day (BID) | ORAL | 0 refills | Status: DC

## 2023-01-29 NOTE — Discharge Instructions (Signed)
You have a viral illness which will improve on its own with rest, fluids, and medications to help with your symptoms. COVID testing is pending, staff will call you if this is positive. Wear a mask for 5 days of symptoms while you are in public, then you may remove your mask. You may go back to work if you do not have a fever for 24 hours without any medicines.   We discussed prescriptions that may help with your symptoms: tessalon perles, promethazine DM, and Zofran as needed for nausea and vomiting You may use over the counter medicines as needed: tylenol/motrin, mucinex, zyrtec, Flonase Two teaspoons of honey in 1 cup of warm water every 4-6 hours may help with throat pains. Humidifier in room at nighttime may help soothe cough (clean well daily).   For chest pain, shortness of breath, inability to keep food or fluids down without vomiting, fever that does not respond to tylenol or motrin, or any other severe symptoms, please go to the ER for further evaluation. Return to urgent care as needed, otherwise follow-up with PCP.
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
## 2023-01-29 NOTE — ED Triage Notes (Signed)
Chills, body aches, cough since last night.  States chest burns when coughing.

## 2023-01-30 LAB — SARS CORONAVIRUS 2 (TAT 6-24 HRS): SARS Coronavirus 2: NEGATIVE

## 2023-08-30 ENCOUNTER — Encounter: Payer: Self-pay | Admitting: Family Medicine

## 2023-08-30 ENCOUNTER — Ambulatory Visit (INDEPENDENT_AMBULATORY_CARE_PROVIDER_SITE_OTHER): Payer: PRIVATE HEALTH INSURANCE | Admitting: Family Medicine

## 2023-08-30 VITALS — BP 98/66 | HR 81 | Temp 98.5°F | Ht 66.0 in | Wt 174.4 lb

## 2023-08-30 DIAGNOSIS — J342 Deviated nasal septum: Secondary | ICD-10-CM | POA: Diagnosis not present

## 2023-08-30 DIAGNOSIS — F411 Generalized anxiety disorder: Secondary | ICD-10-CM

## 2023-08-30 DIAGNOSIS — F422 Mixed obsessional thoughts and acts: Secondary | ICD-10-CM

## 2023-08-30 DIAGNOSIS — Z0000 Encounter for general adult medical examination without abnormal findings: Secondary | ICD-10-CM

## 2023-08-30 DIAGNOSIS — N393 Stress incontinence (female) (male): Secondary | ICD-10-CM

## 2023-08-30 LAB — CBC WITH DIFFERENTIAL/PLATELET
Basophils Absolute: 0 10*3/uL (ref 0.0–0.1)
Basophils Relative: 0.8 % (ref 0.0–3.0)
Eosinophils Absolute: 0 10*3/uL (ref 0.0–0.7)
Eosinophils Relative: 0.6 % (ref 0.0–5.0)
HCT: 39 % (ref 36.0–46.0)
Hemoglobin: 12.9 g/dL (ref 12.0–15.0)
Lymphocytes Relative: 37.6 % (ref 12.0–46.0)
Lymphs Abs: 1.6 10*3/uL (ref 0.7–4.0)
MCHC: 33.2 g/dL (ref 30.0–36.0)
MCV: 87.4 fl (ref 78.0–100.0)
Monocytes Absolute: 0.2 10*3/uL (ref 0.1–1.0)
Monocytes Relative: 4.9 % (ref 3.0–12.0)
Neutro Abs: 2.4 10*3/uL (ref 1.4–7.7)
Neutrophils Relative %: 56.1 % (ref 43.0–77.0)
Platelets: 295 10*3/uL (ref 150.0–400.0)
RBC: 4.46 Mil/uL (ref 3.87–5.11)
RDW: 13.7 % (ref 11.5–15.5)
WBC: 4.3 10*3/uL (ref 4.0–10.5)

## 2023-08-30 LAB — LIPID PANEL
Cholesterol: 170 mg/dL (ref 0–200)
HDL: 57.6 mg/dL (ref >39.00)
LDL Cholesterol: 103 mg/dL — ABNORMAL HIGH (ref 0–99)
NonHDL: 112.86
Total CHOL/HDL Ratio: 3
Triglycerides: 49 mg/dL (ref 0.0–149.0)
VLDL: 9.8 mg/dL (ref 0.0–40.0)

## 2023-08-30 LAB — COMPREHENSIVE METABOLIC PANEL WITH GFR
ALT: 10 U/L (ref 0–35)
AST: 12 U/L (ref 0–37)
Albumin: 4.5 g/dL (ref 3.5–5.2)
Alkaline Phosphatase: 59 U/L (ref 39–117)
BUN: 11 mg/dL (ref 6–23)
CO2: 29 meq/L (ref 19–32)
Calcium: 9.5 mg/dL (ref 8.4–10.5)
Chloride: 106 meq/L (ref 96–112)
Creatinine, Ser: 0.69 mg/dL (ref 0.40–1.20)
GFR: 109.17 mL/min (ref >60.00)
Glucose, Bld: 94 mg/dL (ref 70–99)
Potassium: 4.8 meq/L (ref 3.5–5.1)
Sodium: 139 meq/L (ref 135–145)
Total Bilirubin: 0.4 mg/dL (ref 0.2–1.2)
Total Protein: 6.9 g/dL (ref 6.0–8.3)

## 2023-08-30 LAB — HEMOGLOBIN A1C: Hgb A1c MFr Bld: 5.3 % (ref 4.6–6.5)

## 2023-08-30 LAB — TSH: TSH: 0.51 u[IU]/mL (ref 0.35–5.50)

## 2023-08-30 LAB — T4, FREE: Free T4: 0.8 ng/dL (ref 0.60–1.60)
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
## 2023-09-04 ENCOUNTER — Ambulatory Visit: Payer: Self-pay | Admitting: Family Medicine

## 2024-08-30 ENCOUNTER — Encounter: Payer: PRIVATE HEALTH INSURANCE | Admitting: Family Medicine
